# Patient Record
Sex: Female | Born: 1985
Health system: Southern US, Community
[De-identification: ages and names within clinical notes are randomized; demographics above are authoritative.]

## PROBLEM LIST (undated history)

## (undated) DIAGNOSIS — E079 Disorder of thyroid, unspecified: Secondary | ICD-10-CM

## (undated) DIAGNOSIS — F329 Major depressive disorder, single episode, unspecified: Secondary | ICD-10-CM

## (undated) DIAGNOSIS — F4310 Post-traumatic stress disorder, unspecified: Secondary | ICD-10-CM

## (undated) DIAGNOSIS — F429 Obsessive-compulsive disorder, unspecified: Secondary | ICD-10-CM

## (undated) DIAGNOSIS — F419 Anxiety disorder, unspecified: Secondary | ICD-10-CM

## (undated) DIAGNOSIS — F909 Attention-deficit hyperactivity disorder, unspecified type: Secondary | ICD-10-CM

## (undated) DIAGNOSIS — F32A Depression, unspecified: Secondary | ICD-10-CM

## (undated) DIAGNOSIS — K900 Celiac disease: Secondary | ICD-10-CM

## (undated) HISTORY — DX: Major depressive disorder, single episode, unspecified: F32.9

## (undated) HISTORY — DX: Celiac disease: K90.0

## (undated) HISTORY — DX: Attention-deficit hyperactivity disorder, unspecified type: F90.9

## (undated) HISTORY — DX: Post-traumatic stress disorder, unspecified: F43.10

## (undated) HISTORY — DX: Depression, unspecified: F32.A

## (undated) HISTORY — DX: Obsessive-compulsive disorder, unspecified: F42.9

## (undated) HISTORY — DX: Anxiety disorder, unspecified: F41.9

## (undated) HISTORY — DX: Disorder of thyroid, unspecified: E07.9

---
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
## 2005-04-02 DIAGNOSIS — Q909 Down syndrome, unspecified: Secondary | ICD-10-CM

## 2009-02-04 ENCOUNTER — Emergency Department (HOSPITAL_COMMUNITY): Admission: EM | Admit: 2009-02-04 | Discharge: 2009-02-04 | Payer: Self-pay | Admitting: Emergency Medicine

## 2009-11-17 IMAGING — US US OB TRANSVAGINAL MODIFY
1 series · 14 of 28 positions shown · non-contrast
Comparison: Transabdominal ultrasound pelvis 02/04/2009

CLINICAL DATA: Early pregnancy, cramping

TRANSVAGINAL OBSTETRIC US
TECHNIQUE: Transvaginal ultrasound was performed for complete
evaluation of the gestation as well as the maternal uterus, adnexal
regions, and pelvic cul-de-sac.

[Series 1: us ob transvaginal modify · 0.26mm/px · 14 of 48 slices shown]
[im 2/48]
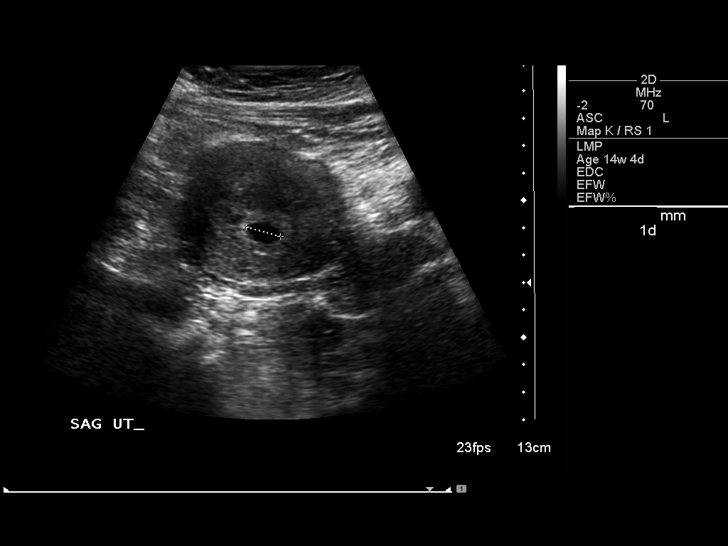
[im 6/48]
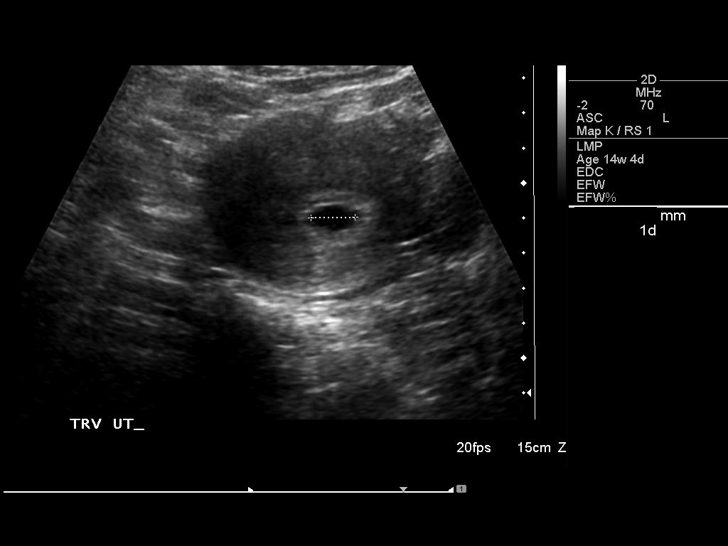
[im 9/48]
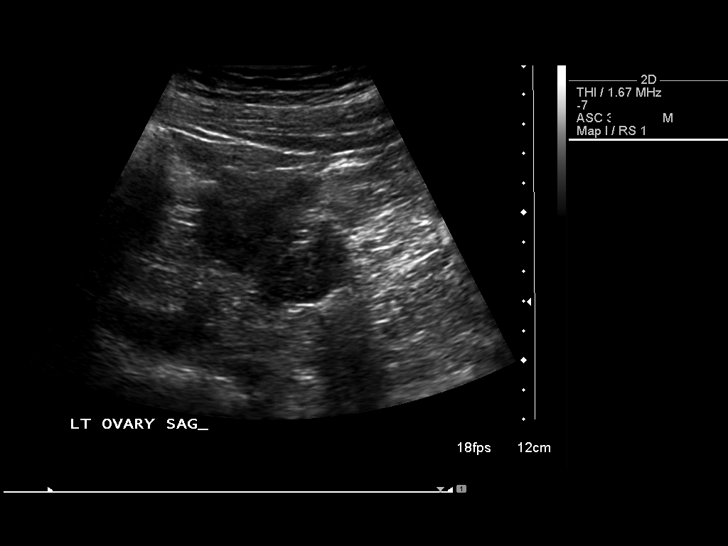
[im 13/48]
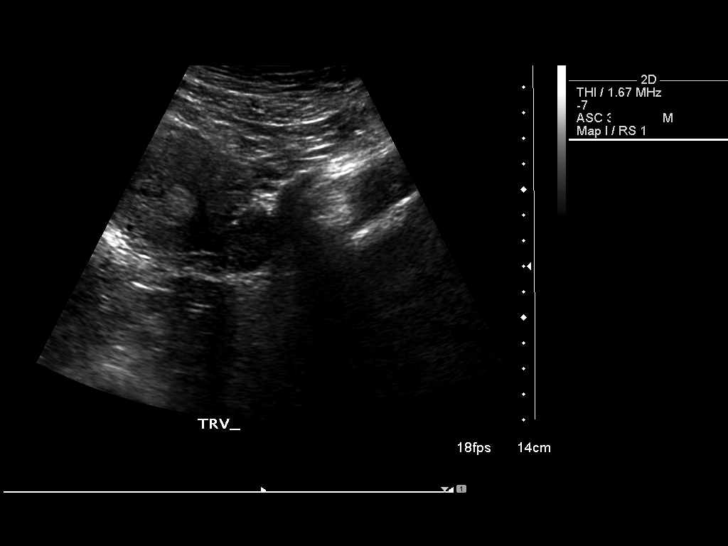
[im 16/48]
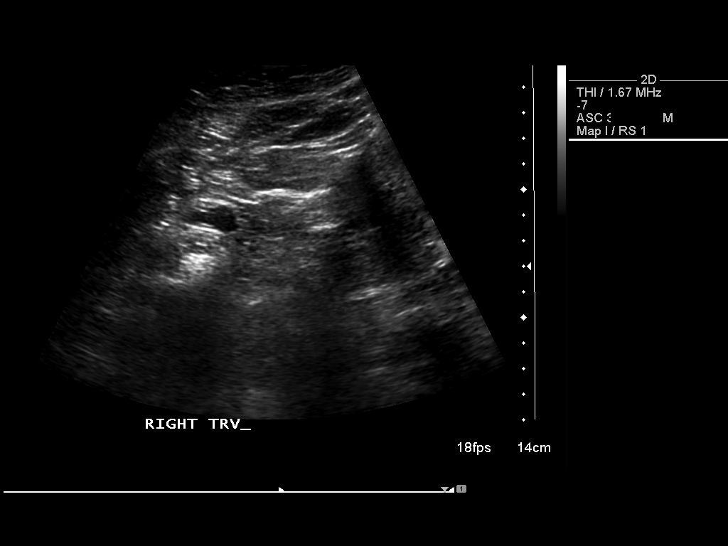
[im 20/48]
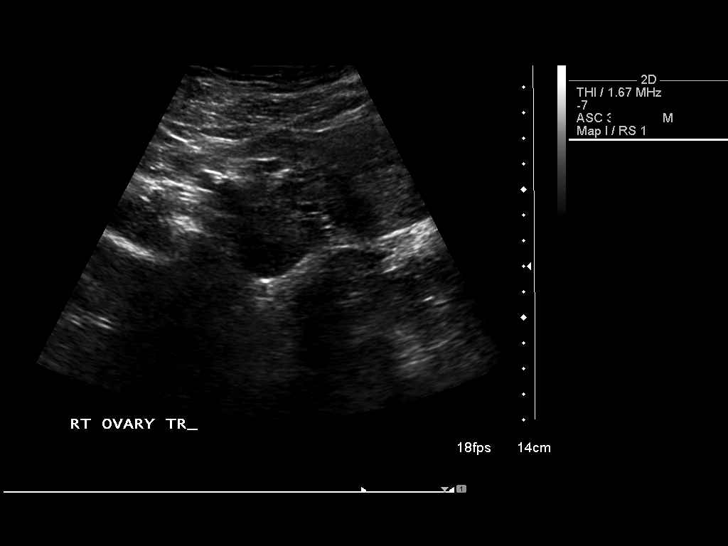
[im 23/48]
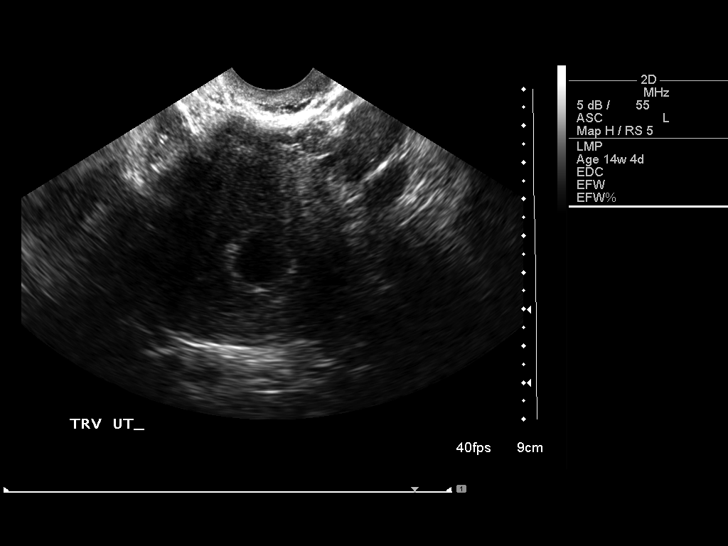
[im 27/48]
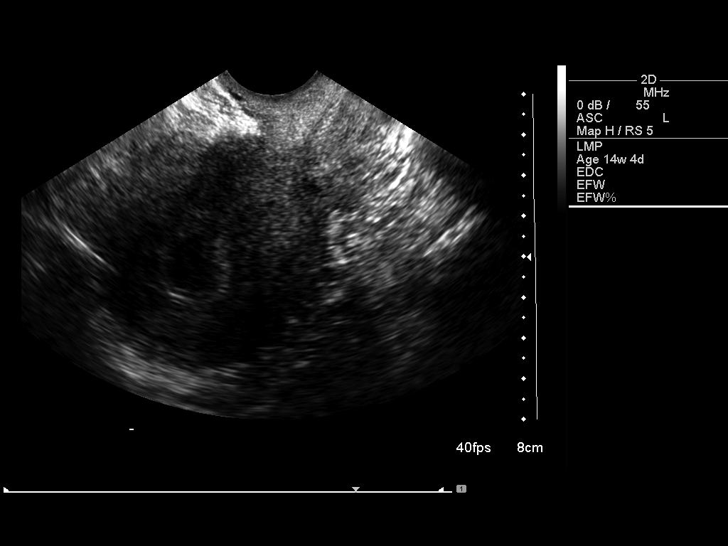
[im 30/48]
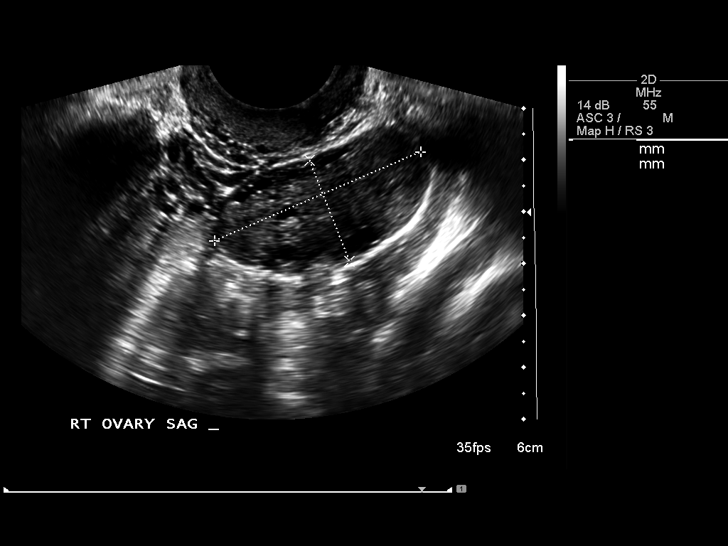
[im 34/48]
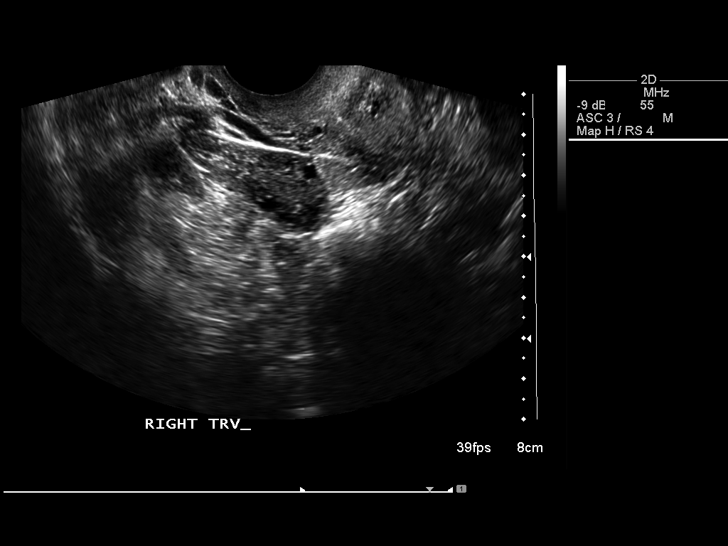
[im 37/48]
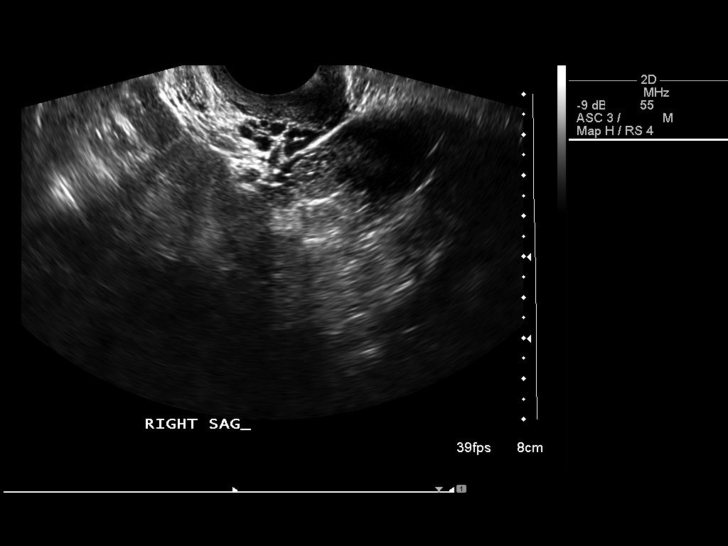
[im 41/48]
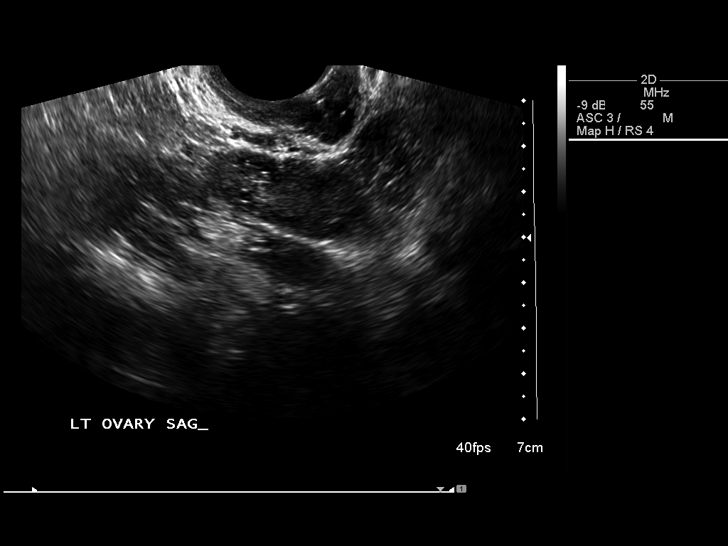
[im 44/48]
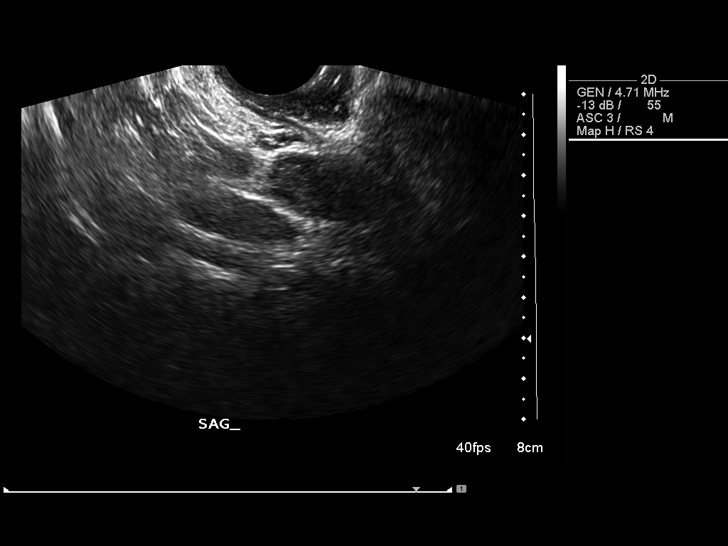
[im 48/48]
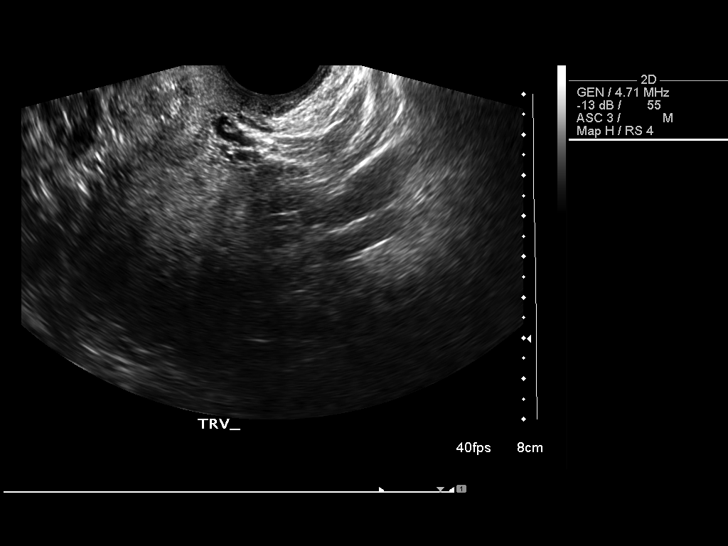

[14 of 28 positions shown; findings below may reference images not displayed]

Transvaginal side of the pelvis was performed in addition to the
transabdominal ultrasound pelvis, already reported. Findings seen
on transvaginal imaging support those seen on transabdominal
imaging.  Intrauterine gestational sac is seen with a mean sac
diameter correspond to 6 weeks 1 day EGA.  Ovaries unremarkable.
No subchorionic hemorrhage. No free fluid or adnexal mass.
IMPRESSION: Transvaginal imaging of pregnant pelvis is correlated with the
transabdominal ultrasound and results of the exams are concordant.
A single intrauterine gestational sac is seen containing a single
yolk sac,.  Follow-up assessment can be performed in the 10 14 days
to establish fetal viability.  Please refer to preceding report.

## 2010-11-07 LAB — BASIC METABOLIC PANEL
CO2: 26 mEq/L (ref 19–32)
GFR calc non Af Amer: >60 mL/min (ref >60)
Glucose, Bld: 98 mg/dL (ref 70–99)
Potassium: 3.6 mEq/L (ref 3.5–5.1)
Sodium: 139 mEq/L (ref 135–145)

## 2010-11-07 LAB — URINALYSIS, ROUTINE W REFLEX MICROSCOPIC
Bilirubin Urine: NEGATIVE
Glucose, UA: NEGATIVE mg/dL
Specific Gravity, Urine: >1.03 — ABNORMAL HIGH (ref 1.005–1.030)
Urobilinogen, UA: 0.2 mg/dL (ref 0.0–1.0)

## 2010-11-07 LAB — URINE MICROSCOPIC-ADD ON

## 2010-11-07 LAB — DIFFERENTIAL
Basophils Absolute: 0.1 10*3/uL (ref 0.0–0.1)
Eosinophils Relative: 1 % (ref 0–5)
Lymphocytes Relative: 21 % (ref 12–46)
Monocytes Absolute: 0.6 10*3/uL (ref 0.1–1.0)

## 2010-11-07 LAB — CBC
HCT: 37.6 % (ref 36.0–46.0)
Hemoglobin: 13.3 g/dL (ref 12.0–15.0)
MCHC: 35.3 g/dL (ref 30.0–36.0)
RDW: 12.6 % (ref 11.5–15.5)

## 2010-11-07 LAB — HCG, QUANTITATIVE, PREGNANCY: hCG, Beta Chain, Quant, S: 17171 m[IU]/mL — ABNORMAL HIGH (ref <5)

## 2017-01-06 ENCOUNTER — Other Ambulatory Visit (HOSPITAL_COMMUNITY)
Admission: RE | Admit: 2017-01-06 | Discharge: 2017-01-06 | Disposition: A | Discharge status: Home / Self Care | Payer: Self-pay | Source: Ambulatory Visit | Attending: Obstetrics and Gynecology | Admitting: Obstetrics and Gynecology

## 2017-01-06 ENCOUNTER — Encounter: Payer: Self-pay | Admitting: Obstetrics and Gynecology

## 2017-01-06 ENCOUNTER — Ambulatory Visit (INDEPENDENT_AMBULATORY_CARE_PROVIDER_SITE_OTHER): Payer: Medicaid Other | Admitting: Obstetrics and Gynecology

## 2017-01-06 VITALS — BP 120/80 | HR 82 | Ht 63.75 in | Wt 209.6 lb

## 2017-01-06 DIAGNOSIS — Z308 Encounter for other contraceptive management: Secondary | ICD-10-CM

## 2017-01-06 DIAGNOSIS — Z3009 Encounter for other general counseling and advice on contraception: Secondary | ICD-10-CM

## 2017-01-06 DIAGNOSIS — E02 Subclinical iodine-deficiency hypothyroidism: Secondary | ICD-10-CM

## 2017-01-06 DIAGNOSIS — Z3202 Encounter for pregnancy test, result negative: Secondary | ICD-10-CM

## 2017-01-06 DIAGNOSIS — Z01419 Encounter for gynecological examination (general) (routine) without abnormal findings: Secondary | ICD-10-CM | POA: Insufficient documentation

## 2017-01-06 LAB — POCT URINE PREGNANCY: PREG TEST UR: NEGATIVE

## 2017-01-06 MED ORDER — NORGESTIMATE-ETH ESTRADIOL 0.25-35 MG-MCG PO TABS: 1.0000 | ORAL_TABLET | Freq: Every day | ORAL | 11 refills | Status: DC

## 2017-01-06 NOTE — Addendum Note (Signed)
Addended byGaylyn Rong: Blondine Hottel on: 01/06/2017 01:33 PM   Modules accepted: Orders

## 2017-01-06 NOTE — Progress Notes (Signed)
  Assessment:  Annual Gyn Exam   Plan:  1. Pap smear done, next pap due 3 years  2. Return annually or prn 3.  Annual mammogram advised 4. Will discharge with Rx for Sprintec  Subjective:  Gabrielle Logan is a 31 y.o. female No obstetric history on file. who presents for annual exam. No LMP recorded. She would like her mirena IUD removed. She has had the mirena for 7 years and states she feels uncomfortable like she is pregnant. As a replacement she would like to take birth control pills. She reports adverse reactions with many other forms of contraception.   The following portions of the patient's history were reviewed and updated as appropriate: allergies, current medications, past family history, past medical history, past social history, past surgical history and problem list. No past medical history on file.  No past surgical history on file.  No current outpatient prescriptions on file.  Review of Systems Otherwise negative for acute change except as noted in the HPI.   Objective:  There were no vitals taken for this visit.   BMI: There is no height or weight on file to calculate BMI.  General Appearance: Alert, appropriate appearance for age. No acute distress HEENT: Grossly normal Neck / Thyroid:  Cardiovascular: RRR; normal S1, S2, no murmur Lungs: CTA bilaterally Back: No CVAT Breast Exam: No dimpling, nipple retraction or discharge. No masses or nodes. and No masses or nodes.No dimpling, nipple retraction or discharge. Gastrointestinal: Soft, non-tender, no masses or organomegaly Pelvic Exam: External genitalia: normal general appearance Vaginal: normal mucosa without prolapse or lesions Cervix: IUD successfully removed Adnexa: normal bimanual exam Uterus: small  PAP: Pap smear done today. Lymphatic Exam: Non-palpable nodes in neck, clavicular, axillary, or inguinal regions  Skin: no rash or abnormalities Neurologic: Normal gait and speech, no tremor   Psychiatric: Alert and oriented, appropriate affect.  Urinalysis:Not done  By signing my name below, I, Freida Busmaniana Omoyeni, attest that this documentation has been prepared under the direction and in the presence of Tilda BurrowJohn V Dorian Renfro, MD . Electronically Signed: Freida Busmaniana Omoyeni, Scribe. 01/06/2017. 11:32 AM. I personally performed the services described in this documentation, which was SCRIBED in my presence. The recorded information has been reviewed and considered accurate. It has been edited as necessary during review. Tilda BurrowFERGUSON,Avian Greenawalt V, MD

## 2017-01-06 NOTE — Patient Instructions (Addendum)

## 2017-01-07 LAB — TSH: TSH: 2.69 u[IU]/mL (ref 0.450–4.500)

## 2017-01-07 LAB — HIV ANTIBODY (ROUTINE TESTING W REFLEX): HIV SCREEN 4TH GENERATION: NONREACTIVE

## 2017-01-07 LAB — RPR: RPR: NONREACTIVE

## 2017-01-12 LAB — CYTOLOGY - PAP
DIAGNOSIS: NEGATIVE
HPV (WINDOPATH): DETECTED

## 2017-03-01 ENCOUNTER — Ambulatory Visit: Payer: Medicaid Other | Admitting: Adult Health

## 2017-03-15 ENCOUNTER — Ambulatory Visit: Payer: Medicaid Other | Admitting: Adult Health

## 2018-01-16 ENCOUNTER — Other Ambulatory Visit (HOSPITAL_COMMUNITY)
Admission: RE | Admit: 2018-01-16 | Discharge: 2018-01-16 | Disposition: A | Discharge status: Home / Self Care | Payer: Medicaid Other | Source: Ambulatory Visit | Attending: Advanced Practice Midwife | Admitting: Advanced Practice Midwife

## 2018-01-16 ENCOUNTER — Encounter: Payer: Self-pay | Admitting: Advanced Practice Midwife

## 2018-01-16 ENCOUNTER — Other Ambulatory Visit: Payer: Self-pay

## 2018-01-16 ENCOUNTER — Ambulatory Visit (INDEPENDENT_AMBULATORY_CARE_PROVIDER_SITE_OTHER): Payer: Medicaid Other | Admitting: Advanced Practice Midwife

## 2018-01-16 VITALS — BP 121/79 | HR 88 | Ht 63.0 in | Wt 206.0 lb

## 2018-01-16 DIAGNOSIS — R140 Abdominal distension (gaseous): Secondary | ICD-10-CM

## 2018-01-16 DIAGNOSIS — Z01419 Encounter for gynecological examination (general) (routine) without abnormal findings: Secondary | ICD-10-CM | POA: Diagnosis not present

## 2018-01-16 DIAGNOSIS — Z309 Encounter for contraceptive management, unspecified: Secondary | ICD-10-CM

## 2018-01-16 NOTE — Progress Notes (Signed)
Gabrielle Logan 32 y.o.  Vitals:   01/16/18 1011  BP: 121/79  Pulse: 88     Filed Weights   01/16/18 1011  Weight: 206 lb (93.4 kg)    Past Medical History: Past Medical History:  Diagnosis Date  . ADHD   . Anxiety   . Depression   . OCD (obsessive compulsive disorder)   . PTSD (post-traumatic stress disorder)   . Thyroid disease    hyperthyroid    Past Surgical History: Past Surgical History:  Procedure Laterality Date  . CESAREAN SECTION  2003,2006,2011    Family History: Family History  Problem Relation Age of Onset  . COPD Paternal Grandmother   . Thyroid disease Maternal Grandmother   . Other Father        heart issues  . Thyroid disease Mother   . Hypertension Mother   . Other Son        gastroschisis; wolf parkinson's white syndrome  . Down syndrome Son        passed away at age 687    Social History: Social History   Tobacco Use  . Smoking status: Never Smoker  . Smokeless tobacco: Current User    Types: Snuff  Substance Use Topics  . Alcohol use: Not Currently    Comment: rarely  . Drug use: Not Currently    Types: Marijuana    Comment: occ    Allergies:  Allergies  Allergen Reactions  . Sulfa Antibiotics Anaphylaxis and Rash    Hard to breathe     No current outpatient medications on file.  History of Present Illness: here for pap. Last pap 2018, normal w/+ HR HPV.   Had Mirena removed 5.18.  Was regular, but over the past four months, periods have been more irregular. Also noticed consistent abdominal bloating, used to be intermittent, now is constant for the 4 months. Feels pregnant, but isn't . Wants to be pregnant. Boyfriend of 2 years has never fathered a child.  Review of Systems   Patient denies any headaches, blurred vision, shortness of breath, chest pain, abdominal pain, problems with bowel movements, urination, or intercourse.   Physical Exam: General:  Well developed, well nourished, no acute distress Skin:  Warm  and dry Neck:  Midline trachea, normal thyroid Lungs; Clear to auscultation bilaterally Breast:  No dominant palpable mass, retraction, or nipple discharge Cardiovascular: Regular rate and rhythm Abdomen:  Soft, non tender, doesn't appear bloated beyond normal Pelvic:  External genitalia is normal in appearance.  The vagina is normal in appearance.  The cervix is bulbous.  Uterus is felt to be normal size, shape, and contour.  No adnexal masses or tenderness noted. Exam limited by habitus Extremities:  No swelling or varicosities noted Psych:  No mood changes.     Impression: normal GYN exam; if pap still + HR HPV, get colpo  Abdominal bloating; sounds GI related, but will get pelvic US to R/O ovarian issue  PNV--start now   Semen analysis ordered

## 2018-01-17 ENCOUNTER — Other Ambulatory Visit: Payer: Self-pay | Admitting: *Deleted

## 2018-01-17 DIAGNOSIS — R140 Abdominal distension (gaseous): Secondary | ICD-10-CM

## 2018-01-17 LAB — CYTOLOGY - PAP
CHLAMYDIA, DNA PROBE: NEGATIVE
Diagnosis: NEGATIVE
HPV (WINDOPATH): NOT DETECTED
NEISSERIA GONORRHEA: NEGATIVE

## 2018-01-19 ENCOUNTER — Ambulatory Visit (HOSPITAL_COMMUNITY)
Admission: RE | Admit: 2018-01-19 | Discharge: 2018-01-19 | Disposition: A | Discharge status: Home / Self Care | Payer: Self-pay | Source: Ambulatory Visit | Attending: Advanced Practice Midwife | Admitting: Advanced Practice Midwife

## 2018-01-19 DIAGNOSIS — R140 Abdominal distension (gaseous): Secondary | ICD-10-CM | POA: Insufficient documentation

## 2019-01-17 ENCOUNTER — Ambulatory Visit (INDEPENDENT_AMBULATORY_CARE_PROVIDER_SITE_OTHER): Payer: Self-pay | Admitting: Adult Health

## 2019-01-17 ENCOUNTER — Other Ambulatory Visit: Payer: Self-pay

## 2019-01-17 ENCOUNTER — Encounter: Payer: Self-pay | Admitting: Adult Health

## 2019-01-17 VITALS — Ht 63.25 in | Wt 193.0 lb

## 2019-01-17 DIAGNOSIS — Z98891 History of uterine scar from previous surgery: Secondary | ICD-10-CM | POA: Insufficient documentation

## 2019-01-17 DIAGNOSIS — O3680X0 Pregnancy with inconclusive fetal viability, not applicable or unspecified: Secondary | ICD-10-CM | POA: Insufficient documentation

## 2019-01-17 DIAGNOSIS — Z3A01 Less than 8 weeks gestation of pregnancy: Secondary | ICD-10-CM | POA: Insufficient documentation

## 2019-01-17 DIAGNOSIS — Z8249 Family history of ischemic heart disease and other diseases of the circulatory system: Secondary | ICD-10-CM | POA: Insufficient documentation

## 2019-01-17 DIAGNOSIS — Z8279 Family history of other congenital malformations, deformations and chromosomal abnormalities: Secondary | ICD-10-CM

## 2019-01-17 DIAGNOSIS — Z3201 Encounter for pregnancy test, result positive: Secondary | ICD-10-CM | POA: Insufficient documentation

## 2019-01-17 MED ORDER — PROMETHAZINE HCL 25 MG PO TABS: 25.0000 mg | ORAL_TABLET | Freq: Four times a day (QID) | ORAL | 1 refills | Status: DC | PRN

## 2019-01-17 NOTE — Progress Notes (Signed)
Patient ID: Gabrielle Logan, female   DOB: 1985-11-22, 33 y.o.   MRN: [id number]   TELEHEALTH VIRTUAL GYNECOLOGY VISIT ENCOUNTER NOTE  I connected with Gabrielle Logan on 01/17/19 at 12:00 PM EDT by telephone at home and verified that I am speaking with the correct person using two identifiers.   I discussed the limitations, risks, security and privacy concerns of performing an evaluation and management service by telephone and the availability of in person appointments. I also discussed with the patient that there may be a patient responsible charge related to this service. The patient expressed understanding and agreed to proceed.   History:  Gabrielle Logan is a 33 y.o. [phone] female being evaluated today for missed period and +HPTs x 4, about 5+6 weeks by LMP, with EDD 09/13/19.She is separated, but engaged.She has had nausea and breat tenderness. She denies any abnormal vaginal discharge, bleeding,(spotted x 1) pelvic pain or other concerns.       Past Medical History:  Diagnosis Date  . ADHD   . Anxiety   . Depression   . OCD (obsessive compulsive disorder)   . PTSD (post-traumatic stress disorder)   . Thyroid disease    hyperthyroid   Past Surgical History:  Procedure Laterality Date  . CESAREAN SECTION  2003,2006,2011   The following portions of the patient's history were reviewed and updated as appropriate: allergies, current medications, past family history, past medical history, past social history, past surgical history and problem list.   Health Maintenance: Normal pap with negative HPV 01/16/09.  Review of Systems:  Pertinent items noted in HPI and remainder of comprehensive ROS otherwise negative.  Physical Exam:   General:  Alert, oriented and cooperative.   Mental Status: Normal mood and affect perceived. Normal judgment and thought content.  Physical exam deferred due to nature of the encounter Ht 5' 3.25" (1.607 m)   Wt 193 lb (87.5 kg)   LMP  12/07/2018   BMI 33.92 kg/m per pt. Fall risk is low.   Labs and Imaging No results found for this or any previous visit (from the past 336 hour(s)). No results found.    Assessment and Plan:     1. Positive pregnancy test -4+HPTs  2. Less than [redacted] weeks gestation of pregnancy -continue PNV Meds ordered this encounter  Medications  . promethazine (PHENERGAN) 25 MG tablet    Sig: Take 1 tablet (25 mg total) by mouth every 6 (six) hours as needed for nausea or vomiting.    Dispense:  30 tablet    Refill:  1    Order Specific Question:   Supervising Provider    Answer:   Gabrielle Logan, Gabrielle Logan [[id number]]    3. Encounter to determine fetal viability of pregnancy, single or unspecified fetus -datign [place] in 2 weeks  - US OB Comp Less 14 Wks; Future  4. History of C-section -has had 3  5. Family history of Down syndrome -second son had and he died at age 69 was hit by car  6. Family history of Wolff-Parkinson-White (WPW) syndrome -first son has, and also had gastroschisis        I discussed the assessment and treatment plan with the patient. The patient was provided an opportunity to ask questions and all were answered. The patient agreed with the plan and demonstrated an understanding of the instructions.   The patient was advised to call back or seek an in-person evaluation/go to the ED if the symptoms worsen or if  the condition fails to improve as anticipated.  I provided 11 minutes of non-face-to-face time during this encounter.   Cyril MourningJennifer , NP [facility]

## 2019-01-31 ENCOUNTER — Other Ambulatory Visit: Payer: Self-pay

## 2019-01-31 ENCOUNTER — Ambulatory Visit (INDEPENDENT_AMBULATORY_CARE_PROVIDER_SITE_OTHER): Payer: Medicaid Other

## 2019-01-31 DIAGNOSIS — Z3A01 Less than 8 weeks gestation of pregnancy: Secondary | ICD-10-CM

## 2019-01-31 DIAGNOSIS — O3680X0 Pregnancy with inconclusive fetal viability, not applicable or unspecified: Secondary | ICD-10-CM

## 2019-01-31 NOTE — Progress Notes (Signed)
[place] 6+5 wks,single IUP w/ys,positive fht 114 bpm,normal ovaries bilat,crl 8.38 mm

## 2019-03-06 ENCOUNTER — Telehealth: Payer: Self-pay | Admitting: Obstetrics & Gynecology

## 2019-03-06 NOTE — Telephone Encounter (Signed)
Patient called, stated that she's [redacted] weeks pregnant.  Stated that she thinks she has a pinched nerve that's affecting her left side and leg gives way.  She stated that her employer is a requesting a note for work.  She stated that she's is having to call out due to the pain.  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  [phone]

## 2019-03-07 ENCOUNTER — Encounter: Payer: Self-pay | Admitting: Adult Health

## 2019-03-07 ENCOUNTER — Telehealth: Payer: Self-pay | Admitting: Women's Health

## 2019-03-07 ENCOUNTER — Telehealth: Payer: Self-pay | Admitting: *Deleted

## 2019-03-07 NOTE — Telephone Encounter (Signed)
Patient called because she is still in a lot of pain and stated she didn't get a call back yesterday.  [phone]

## 2019-03-07 NOTE — Telephone Encounter (Signed)
Patient stated she thinks she has a pinched nerve that is affecting her left side and leg gives out at times when she is standing. Lower left side of her back feels like there may be a "knot" but she does not know. She had sciatica in the past with previous pregnancies and was told to stay off her feet and to stretch.  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  She is off the next four days but is unsure what she needs to do. Please advise.

## 2019-03-07 NOTE — Progress Notes (Signed)
Note to excuse 8/5 and 03/07/19

## 2019-03-07 NOTE — Telephone Encounter (Signed)
About [redacted] weeks pregnant, lower left back feels tender and throbs and leg feels "fuzzy" ?pinched nerve, has appt 8/11, Has tried tylenol, try ice, and rest, has missed work this week, needs note for work missed 8/5 and 8/6 and is off til 8/1, so rest and ice.

## 2019-03-11 ENCOUNTER — Other Ambulatory Visit: Payer: Self-pay | Admitting: Obstetrics and Gynecology

## 2019-03-11 DIAGNOSIS — Z3682 Encounter for antenatal screening for nuchal translucency: Secondary | ICD-10-CM

## 2019-03-12 ENCOUNTER — Ambulatory Visit (INDEPENDENT_AMBULATORY_CARE_PROVIDER_SITE_OTHER): Payer: Medicaid Other | Admitting: Advanced Practice Midwife

## 2019-03-12 ENCOUNTER — Ambulatory Visit (INDEPENDENT_AMBULATORY_CARE_PROVIDER_SITE_OTHER): Payer: Medicaid Other

## 2019-03-12 ENCOUNTER — Ambulatory Visit: Payer: Medicaid Other | Admitting: *Deleted

## 2019-03-12 ENCOUNTER — Encounter: Payer: Self-pay | Admitting: Advanced Practice Midwife

## 2019-03-12 ENCOUNTER — Other Ambulatory Visit: Payer: Self-pay

## 2019-03-12 VITALS — BP 108/65 | HR 74 | Wt 198.5 lb

## 2019-03-12 DIAGNOSIS — Z1371 Encounter for nonprocreative screening for genetic disease carrier status: Secondary | ICD-10-CM

## 2019-03-12 DIAGNOSIS — Z1389 Encounter for screening for other disorder: Secondary | ICD-10-CM

## 2019-03-12 DIAGNOSIS — Z363 Encounter for antenatal screening for malformations: Secondary | ICD-10-CM

## 2019-03-12 DIAGNOSIS — Z331 Pregnant state, incidental: Secondary | ICD-10-CM

## 2019-03-12 DIAGNOSIS — Z3682 Encounter for antenatal screening for nuchal translucency: Secondary | ICD-10-CM | POA: Diagnosis not present

## 2019-03-12 DIAGNOSIS — Z3A12 12 weeks gestation of pregnancy: Secondary | ICD-10-CM

## 2019-03-12 DIAGNOSIS — Z3490 Encounter for supervision of normal pregnancy, unspecified, unspecified trimester: Secondary | ICD-10-CM | POA: Insufficient documentation

## 2019-03-12 DIAGNOSIS — Z3481 Encounter for supervision of other normal pregnancy, first trimester: Secondary | ICD-10-CM | POA: Diagnosis not present

## 2019-03-12 DIAGNOSIS — E0590 Thyrotoxicosis, unspecified without thyrotoxic crisis or storm: Secondary | ICD-10-CM

## 2019-03-12 DIAGNOSIS — Z1379 Encounter for other screening for genetic and chromosomal anomalies: Secondary | ICD-10-CM

## 2019-03-12 LAB — POCT URINALYSIS DIPSTICK OB
Blood, UA: NEGATIVE
Glucose, UA: NEGATIVE
Ketones, UA: NEGATIVE
Leukocytes, UA: NEGATIVE
Nitrite, UA: NEGATIVE

## 2019-03-12 MED ORDER — BLOOD PRESSURE MONITOR MISC: 0 refills | Status: DC

## 2019-03-12 NOTE — Progress Notes (Signed)
[place] 12+3 wks,measurements c/w dates,crl 63.55 mm,fhr 166 bpm,posterior placenta gr 0,normal ovaries bilat,NB present,NT 1.6 mm

## 2019-03-12 NOTE — Patient Instructions (Signed)
 First Trimester of Pregnancy The first trimester of pregnancy is from week 1 until the end of week 12 (months 1 through 3). A week after a sperm fertilizes an egg, the egg will implant on the wall of the uterus. This embryo will begin to develop into a baby. Genes from you and your partner are forming the baby. The female genes determine whether the baby is a boy or a girl. At 6-8 weeks, the eyes and face are formed, and the heartbeat can be seen on ultrasound. At the end of 12 weeks, all the baby's organs are formed.  Now that you are pregnant, you will want to do everything you can to have a healthy baby. Two of the most important things are to get good prenatal care and to follow your health care provider's instructions. Prenatal care is all the medical care you receive before the baby's birth. This care will help prevent, find, and treat any problems during the pregnancy and childbirth. BODY CHANGES Your body goes through many changes during pregnancy. The changes vary from woman to woman.   You may gain or lose a couple of pounds at first.  You may feel sick to your stomach (nauseous) and throw up (vomit). If the vomiting is uncontrollable, call your health care provider.  You may tire easily.  You may develop headaches that can be relieved by medicines approved by your health care provider.  You may urinate more often. Painful urination may mean you have a bladder infection.  You may develop heartburn as a result of your pregnancy.  You may develop constipation because certain hormones are causing the muscles that push waste through your intestines to slow down.  You may develop hemorrhoids or swollen, bulging veins (varicose veins).  Your breasts may begin to grow larger and become tender. Your nipples may stick out more, and the tissue that surrounds them (areola) may become darker.  Your gums may bleed and may be sensitive to brushing and flossing.  Dark spots or blotches  (chloasma, mask of pregnancy) may develop on your face. This will likely fade after the baby is born.  Your menstrual periods will stop.  You may have a loss of appetite.  You may develop cravings for certain kinds of food.  You may have changes in your emotions from day to day, such as being excited to be pregnant or being concerned that something may go wrong with the pregnancy and baby.  You may have more vivid and strange dreams.  You may have changes in your hair. These can include thickening of your hair, rapid growth, and changes in texture. Some women also have hair loss during or after pregnancy, or hair that feels dry or thin. Your hair will most likely return to normal after your baby is born. WHAT TO EXPECT AT YOUR PRENATAL VISITS During a routine prenatal visit:  You will be weighed to make sure you and the baby are growing normally.  Your blood pressure will be taken.  Your abdomen will be measured to track your baby's growth.  The fetal heartbeat will be listened to starting around week 10 or 12 of your pregnancy.  Test results from any previous visits will be discussed. Your health care provider may ask you:  How you are feeling.  If you are feeling the baby move.  If you have had any abnormal symptoms, such as leaking fluid, bleeding, severe headaches, or abdominal cramping.  If you have any questions. Other   tests that may be performed during your first trimester include:  Blood tests to find your blood type and to check for the presence of any previous infections. They will also be used to check for low iron levels (anemia) and Rh antibodies. Later in the pregnancy, blood tests for diabetes will be done along with other tests if problems develop.  Urine tests to check for infections, diabetes, or protein in the urine.  An ultrasound to confirm the proper growth and development of the baby.  An amniocentesis to check for possible genetic problems.  Fetal  screens for spina bifida and Down syndrome.  You may need other tests to make sure you and the baby are doing well. HOME CARE INSTRUCTIONS  Medicines  Follow your health care provider's instructions regarding medicine use. Specific medicines may be either safe or unsafe to take during pregnancy.  Take your prenatal vitamins as directed.  If you develop constipation, try taking a stool softener if your health care provider approves. Diet  Eat regular, well-balanced meals. Choose a variety of foods, such as meat or vegetable-based protein, fish, milk and low-fat dairy products, vegetables, fruits, and whole grain breads and cereals. Your health care provider will help you determine the amount of weight gain that is right for you.  Avoid raw meat and uncooked cheese. These carry germs that can cause birth defects in the baby.  Eating four or five small meals rather than three large meals a day may help relieve nausea and vomiting. If you start to feel nauseous, eating a few soda crackers can be helpful. Drinking liquids between meals instead of during meals also seems to help nausea and vomiting.  If you develop constipation, eat more high-fiber foods, such as fresh vegetables or fruit and whole grains. Drink enough fluids to keep your urine clear or pale yellow. Activity and Exercise  Exercise only as directed by your health care provider. Exercising will help you:  Control your weight.  Stay in shape.  Be prepared for labor and delivery.  Experiencing pain or cramping in the lower abdomen or low back is a good sign that you should stop exercising. Check with your health care provider before continuing normal exercises.  Try to avoid standing for long periods of time. Move your legs often if you must stand in one place for a long time.  Avoid heavy lifting.  Wear low-heeled shoes, and practice good posture.  You may continue to have sex unless your health care provider directs you  otherwise. Relief of Pain or Discomfort  Wear a good support bra for breast tenderness.   Take warm sitz baths to soothe any pain or discomfort caused by hemorrhoids. Use hemorrhoid cream if your health care provider approves.   Rest with your legs elevated if you have leg cramps or low back pain.  If you develop varicose veins in your legs, wear support hose. Elevate your feet for 15 minutes, 3-4 times a day. Limit salt in your diet. Prenatal Care  Schedule your prenatal visits by the twelfth week of pregnancy. They are usually scheduled monthly at first, then more often in the last 2 months before delivery.  Write down your questions. Take them to your prenatal visits.  Keep all your prenatal visits as directed by your health care provider. Safety  Wear your seat belt at all times when driving.  Make a list of emergency phone numbers, including numbers for family, friends, the hospital, and police and fire departments. General   Tips  Ask your health care provider for a referral to a local prenatal education class. Begin classes no later than at the beginning of month 6 of your pregnancy.  Ask for help if you have counseling or nutritional needs during pregnancy. Your health care provider can offer advice or refer you to specialists for help with various needs.  Do not use hot tubs, steam rooms, or saunas.  Do not douche or use tampons or scented sanitary pads.  Do not cross your legs for long periods of time.  Avoid cat litter boxes and soil used by cats. These carry germs that can cause birth defects in the baby and possibly loss of the fetus by miscarriage or stillbirth.  Avoid all smoking, herbs, alcohol, and medicines not prescribed by your health care provider. Chemicals in these affect the formation and growth of the baby.  Schedule a dentist appointment. At home, brush your teeth with a soft toothbrush and be gentle when you floss. SEEK MEDICAL CARE IF:   You have  dizziness.  You have mild pelvic cramps, pelvic pressure, or nagging pain in the abdominal area.  You have persistent nausea, vomiting, or diarrhea.  You have a bad smelling vaginal discharge.  You have pain with urination.  You notice increased swelling in your face, hands, legs, or ankles. SEEK IMMEDIATE MEDICAL CARE IF:   You have a fever.  You are leaking fluid from your vagina.  You have spotting or bleeding from your vagina.  You have severe abdominal cramping or pain.  You have rapid weight gain or loss.  You vomit blood or material that looks like coffee grounds.  You are exposed to German measles and have never had them.  You are exposed to fifth disease or chickenpox.  You develop a severe headache.  You have shortness of breath.  You have any kind of trauma, such as from a fall or a car accident. Document Released: 07/12/2001 Document Revised: 12/02/2013 Document Reviewed: 05/28/2013 Exit[employer] Patient Information 2015 [employer]. This information is not intended to replace advice given to you by your health care provider. Make sure you discuss any questions you have with your health care provider.   Nausea & Vomiting  Have saltine crackers or pretzels by your bed and eat a few bites before you raise your head out of bed in the morning  Eat small frequent meals throughout the day instead of large meals  Drink plenty of fluids throughout the day to stay hydrated, just don't drink a lot of fluids with your meals.  This can make your stomach fill up faster making you feel sick  Do not brush your teeth right after you eat  Products with real ginger are good for nausea, like ginger ale and ginger hard candy Make sure it says made with real ginger!  Sucking on sour candy like lemon heads is also good for nausea  If your prenatal vitamins make you nauseated, take them at night so you will sleep through the nausea  Sea Bands  If you feel like you need  medicine for the nausea & vomiting please let us know  If you are unable to keep any fluids or food down please let us know   Constipation  Drink plenty of fluid, preferably water, throughout the day  Eat foods high in fiber such as fruits, vegetables, and grains  Exercise, such as walking, is a good way to keep your bowels regular  Drink warm fluids, especially warm   prune juice, or decaf coffee  Eat a 1/2 cup of real oatmeal (not instant), 1/2 cup applesauce, and 1/2-1 cup warm prune juice every day  If needed, you may take Colace (docusate sodium) stool softener once or twice a day to help keep the stool soft. If you are pregnant, wait until you are out of your first trimester (12-14 weeks of pregnancy)  If you still are having problems with constipation, you may take Miralax once daily as needed to help keep your bowels regular.  If you are pregnant, wait until you are out of your first trimester (12-14 weeks of pregnancy)  Safe Medications in Pregnancy   Acne: Benzoyl Peroxide Salicylic Acid  Backache/Headache: Tylenol: 2 regular strength every 4 hours OR              2 Extra strength every 6 hours  Colds/Coughs/Allergies: Benadryl (alcohol free) 25 mg every 6 hours as needed Breath right strips Claritin Cepacol throat lozenges Chloraseptic throat spray Cold-Eeze- up to three times per day Cough drops, alcohol free Flonase (by prescription only) Guaifenesin Mucinex Robitussin DM (plain only, alcohol free) Saline nasal spray/drops Sudafed (pseudoephedrine) & Actifed ** use only after [redacted] weeks gestation and if you do not have high blood pressure Tylenol Vicks Vaporub Zinc lozenges Zyrtec   Constipation: Colace Ducolax suppositories Fleet enema Glycerin suppositories Metamucil Milk of magnesia Miralax Senokot Smooth move tea  Diarrhea: Kaopectate Imodium A-D  *NO pepto Bismol  Hemorrhoids: Anusol Anusol HC Preparation  H Tucks  Indigestion: Tums Maalox Mylanta Zantac  Pepcid  Insomnia: Benadryl (alcohol free) 25mg every 6 hours as needed Tylenol PM Unisom, no Gelcaps  Leg Cramps: Tums MagGel  Nausea/Vomiting:  Bonine Dramamine Emetrol Ginger extract Sea bands Meclizine  Nausea medication to take during pregnancy:  Unisom (doxylamine succinate 25 mg tablets) Take one tablet daily at bedtime. If symptoms are not adequately controlled, the dose can be increased to a maximum recommended dose of two tablets daily (1/2 tablet in the morning, 1/2 tablet mid-afternoon and one at bedtime). Vitamin B6 100mg tablets. Take one tablet twice a day (up to 200 mg per day).  Skin Rashes: Aveeno products Benadryl cream or 25mg every 6 hours as needed Calamine Lotion 1% cortisone cream  Yeast infection: Gyne-lotrimin 7 Monistat 7   **If taking multiple medications, please check labels to avoid duplicating the same active ingredients **take medication as directed on the label ** Do not exceed 4000 mg of tylenol in 24 hours **Do not take medications that contain aspirin or ibuprofen      

## 2019-03-12 NOTE — Progress Notes (Signed)
INITIAL OBSTETRICAL VISIT Patient name: Gabrielle Logan MRN [id number]  Date of birth: 12-28-1985 Chief Complaint:   Initial Prenatal Visit (left lower back pain)  History of Present Illness:   Gabrielle Logan is a 33 y.o. 654P2102 Caucasian female at 132w3d by LMP/7 week US with an Estimated Date of Delivery: 09/21/19 being seen today for her initial obstetrical visit.   Her obstetrical history is significant for first child w/gastroscheisi (CS), 2nd child w/DS (died age 35 after being hit by a car), and a normal 3rd pregnancy. Has had repeat CS. New  FOB.     Today she reports nausea is abating, has some left sided MSK pain for about a week. Uses massage, cold packs..  Patient's last menstrual period was 12/07/2018. Last pap 2019  Results were: normal Review of Systems:   Pertinent items are noted in HPI Denies cramping/contractions, leakage of fluid, vaginal bleeding, abnormal vaginal discharge w/ itching/odor/irritation, headaches, visual changes, shortness of breath, chest pain, abdominal pain, severe nausea/vomiting, or problems with urination or bowel movements unless otherwise stated above.  Pertinent History Reviewed:  Reviewed past medical,surgical, social, obstetrical and family history.  Reviewed problem list, medications and allergies. OB History  Gravida Para Term Preterm AB Living  4 3 2 1   2   SAB TAB Ectopic Multiple Live Births          3    # Outcome Date GA Lbr Len/2nd Weight Sex Delivery Anes PTL Lv  4 Current           3 Term 09/24/09 3283w0d  7 lb 2 oz (3.232 kg) M CS-LTranv Spinal N LIV  2 Term 04/02/05 2754w0d  6 lb 9 oz (2.977 kg) M CS-LTranv Spinal  DEC     Complications: Down syndrome  1 Preterm 02/02/02 8010w0d  5 lb 13 oz (2.637 kg) M CS-LTranv Spinal N LIV     Complications: Gastroschisis   Physical Assessment:   Vitals:   03/12/19 1420  BP: 108/65  Pulse: 74  Weight: 198 lb 8 oz (90 kg)  Body mass index is 34.89 kg/m.       Physical  Examination:  General appearance - well appearing, and in no distress  Mental status - alert, oriented to person, place, and time  Psych:  She has a normal mood and affect  Skin - warm and dry, normal color, no suspicious lesions noted  Chest - effort normal, all lung fields clear to auscultation bilaterally  Heart - normal rate and regular rhythm  Abdomen - soft, nontender  Extremities:  No swelling or varicosities noted      via US US 12+3 wks,measurements c/w dates,crl 63.55 mm,fhr 166 bpm,posterior placenta gr 0,normal ovaries bilat,NB present,NT 1.6 mm Results for orders placed or performed in visit on 03/12/19 (from the past 24 hour(s))  POC Urinalysis Dipstick OB   Collection Time: 03/12/19  2:37 PM  Result Value Ref Range   Color, UA     Clarity, UA     Glucose, UA Negative Negative   Bilirubin, UA     Ketones, UA neg    Spec Grav, UA     Blood, UA neg    pH, UA     POC,PROTEIN,UA Trace Negative, Trace, Small (1+), Moderate (2+), Large (3+), 4+   Urobilinogen, UA     Nitrite, UA neg    Leukocytes, UA Negative Negative   Appearance     Odor      Assessment & Plan:  1) Low-Risk Pregnancy [id number] at [redacted]w[redacted]d with an Estimated Date of Delivery: 09/21/19   2) Initial OB visit  3) Hx CS X3  4)  Was told at HD in [place] that she was "hyperthyroid", no meds rxd, TSH 2018 here 2.  Will check again today.    Meds:  Meds ordered this encounter  Medications  . Blood Pressure Monitor MISC    Sig: For regular home bp monitoring during pregnancy    Dispense:  1 each    Refill:  0    Z34.90    Initial labs obtained Continue prenatal vitamins Reviewed n/v relief measures and warning s/s to report Reviewed recommended weight gain based on pre-gravid BMI Encouraged well-balanced diet Watched video for carrier screening/genetic testing:  Genetic Screening discussed First Screen and Integrated Screen: requested Cystic fibrosis screening requested SMA screening  requested Fragile X screening requested Ultrasound discussed; fetal survey: requested CCNC completed  Follow-up: Return in about 6 weeks (around 04/23/2019) for [place], [id number].   Orders Placed This Encounter  Procedures  . GC/Chlamydia Probe Amp  . Urine Culture  . Integrated 1  . Obstetric Panel, Including HIV  . Sickle cell screen  . Urinalysis, Routine w reflex microscopic  . MaterniT 21 plus Core, Blood  . Inheritest Core(CF97,SMA,FraX)  . Pain Management Screening Profile (10S)  . TSH  . POC Urinalysis Dipstick OB    Gabrielle Fudge DNP, CNM 03/12/2019 3:31 PM

## 2019-03-13 LAB — TSH: TSH: 1.58 u[IU]/mL (ref 0.450–4.500)

## 2019-03-14 LAB — URINE CULTURE

## 2019-03-20 IMAGING — US US PELVIS COMPLETE TRANSABD/TRANSVAG
1 series · 14 of 25 positions shown · non-contrast
Comparison: None

CLINICAL DATA: Abdominal bloating for 4 months. Previous C-section
x3. LMP 01/11/2018. Gravida 3 para 3.

EXAM:
TRANSABDOMINAL AND TRANSVAGINAL ULTRASOUND OF PELVIS
TECHNIQUE: Both transabdominal and transvaginal ultrasound examinations of the
pelvis were performed. Transabdominal technique was performed for
global imaging of the pelvis including uterus, ovaries, adnexal
regions, and pelvic cul-de-sac. It was necessary to proceed with
endovaginal exam following the transabdominal exam to visualize the
endometrium and ovaries.

[Series 1: us pelvis complete transabd/transvag · 0.24mm/px · 14 of 66 slices shown]
[im 1/66]
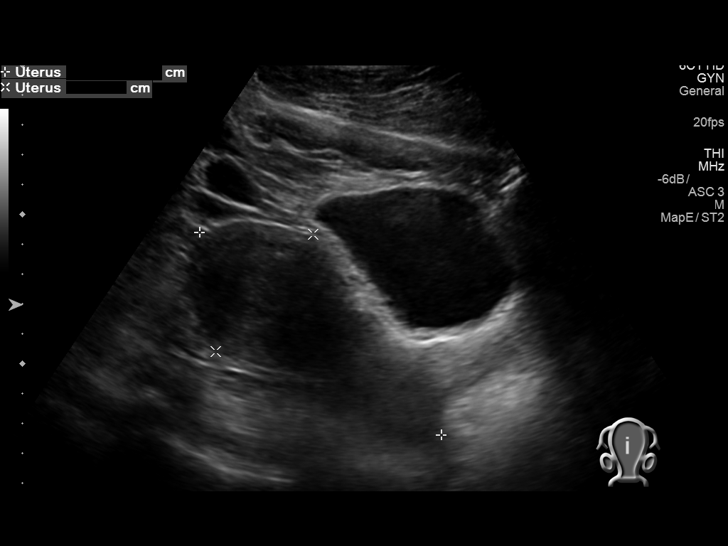
[im 6/66]
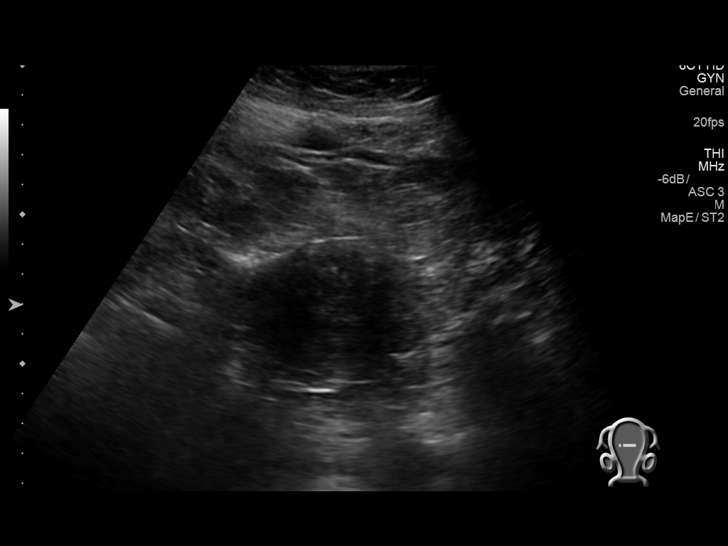
[im 11/66]
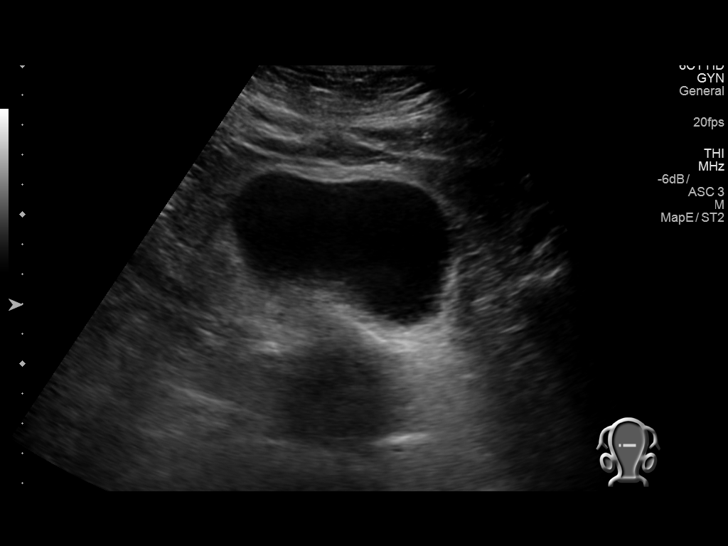
[im 17/66]
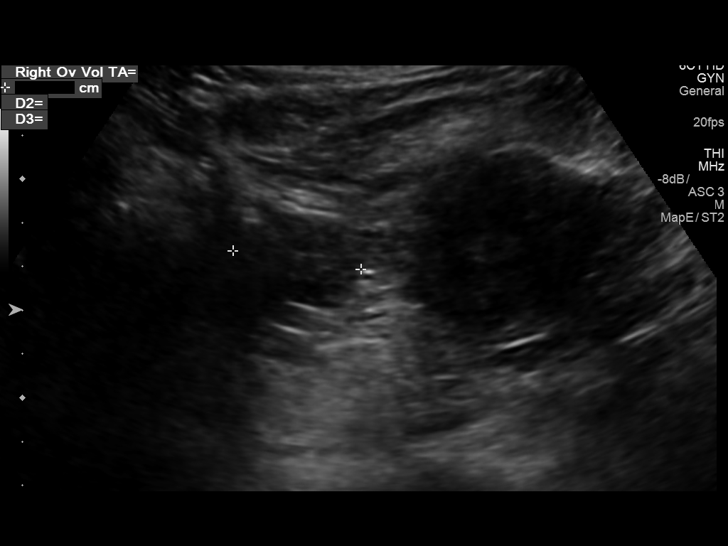
[im 22/66]
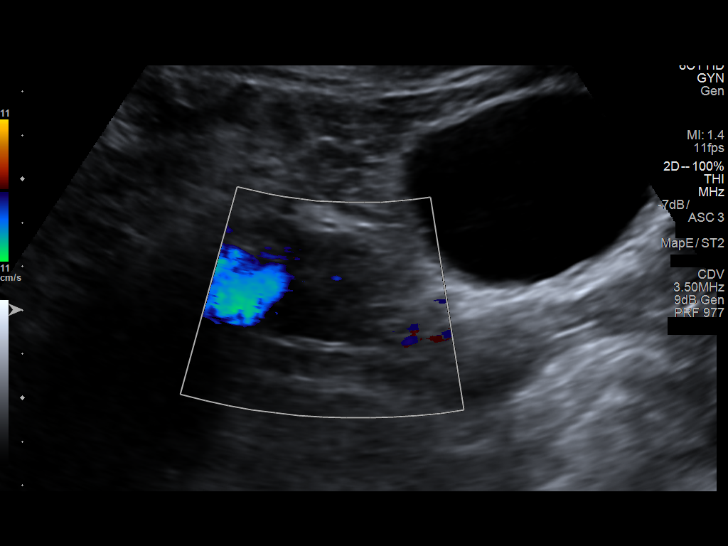
[im 25/66]
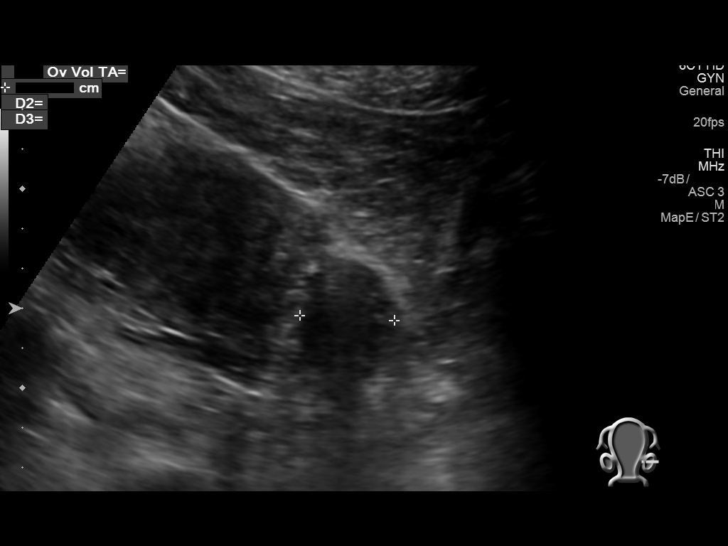
[im 30/66]
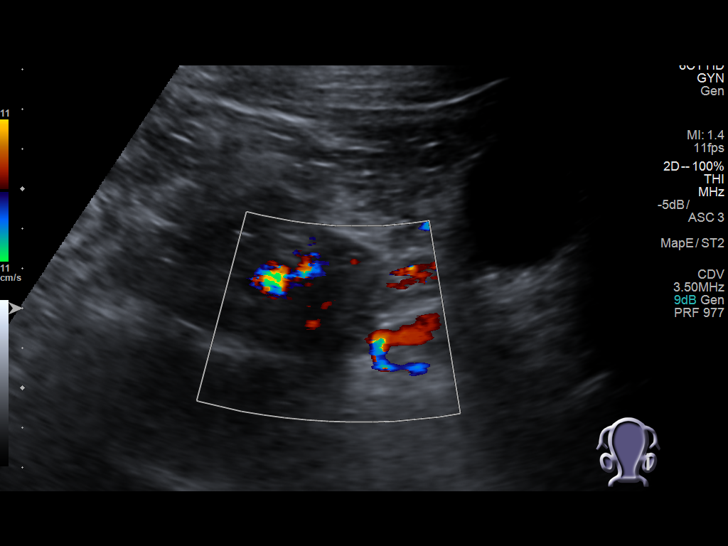
[im 36/66]
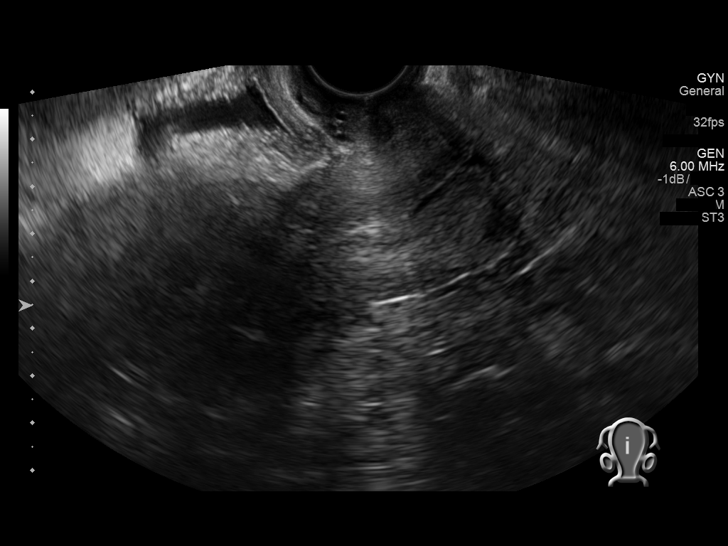
[im 41/66]
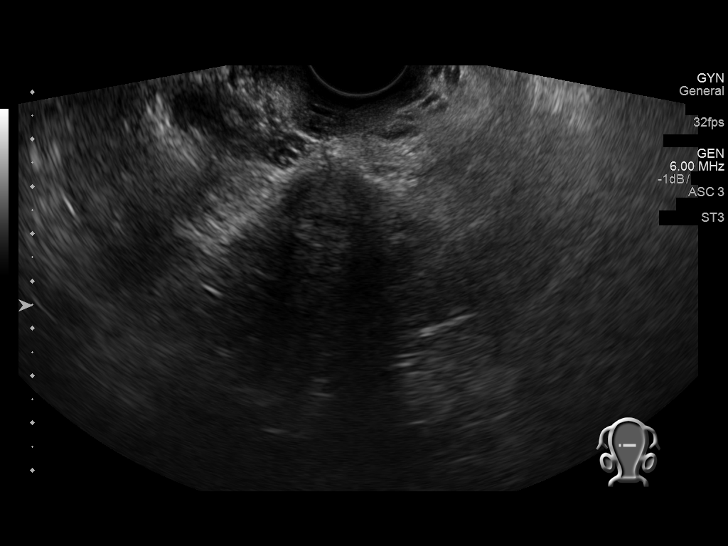
[im 44/66]
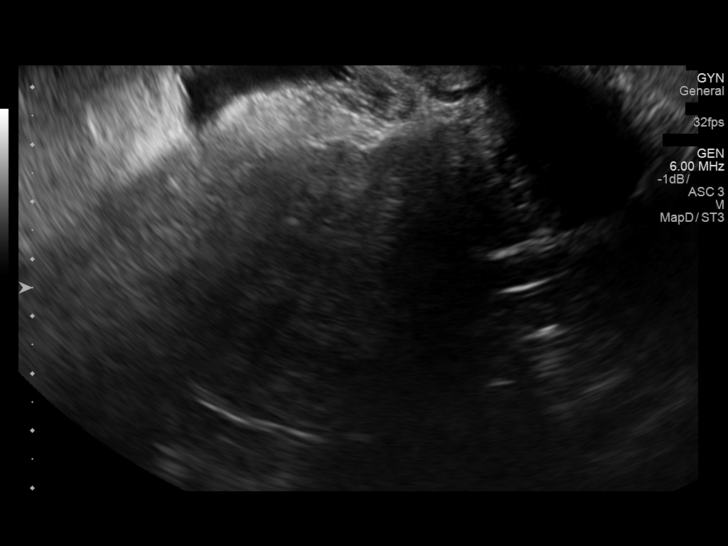
[im 49/66]
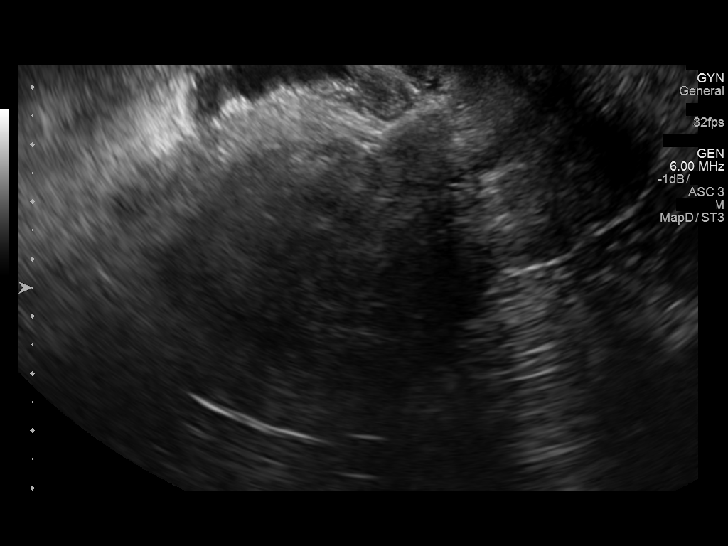
[im 55/66]
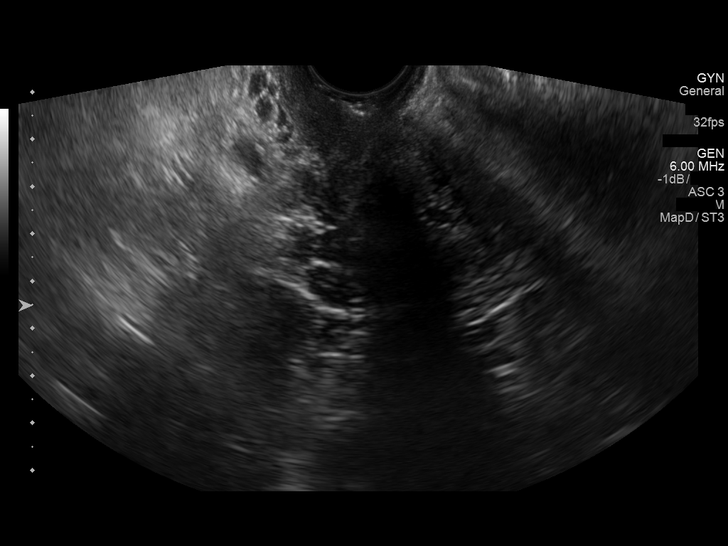
[im 60/66]
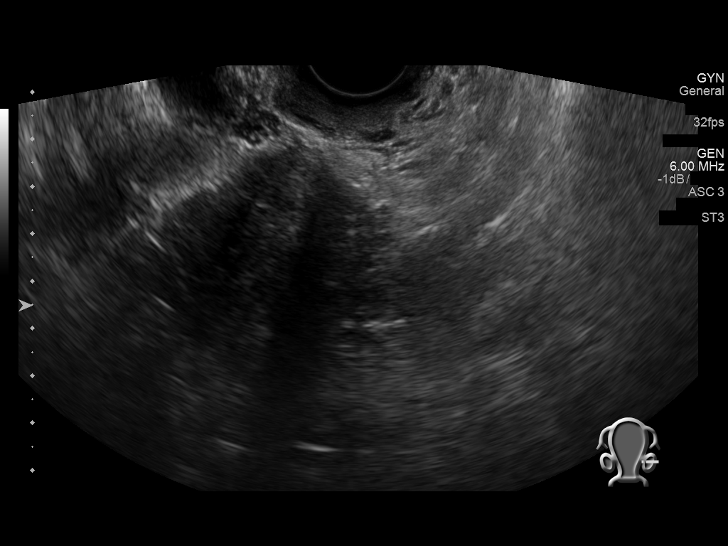
[im 66/66]
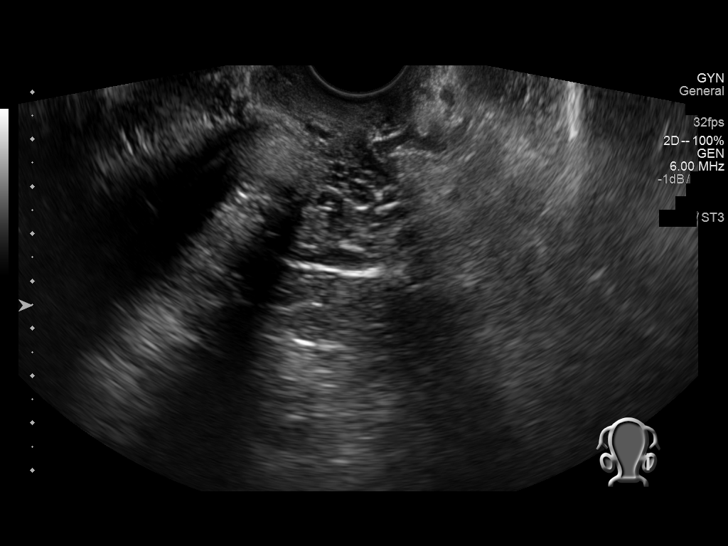

[14 of 25 positions shown; findings below may reference images not displayed]

FINDINGS: Uterus

Measurements: 10.6 x 5.1 x 5.0 centimeters. No fibroids or other
mass visualized.

Endometrium

Thickness: 7 millimeters.  No focal abnormality visualized.

Right ovary

Measurements: 3.0 x 3.1 x 2.5 centimeters. Normal appearance/no
adnexal mass.

Left ovary

Measurements: 2.4 x 2.7 x 3.0 centimeters. Normal appearance/no
adnexal mass.

Other findings

No abnormal free fluid.
IMPRESSION: Normal pelvic ultrasound.

## 2019-04-02 LAB — URINALYSIS, ROUTINE W REFLEX MICROSCOPIC
Bilirubin, UA: NEGATIVE
Glucose, UA: NEGATIVE
Ketones, UA: NEGATIVE
Leukocytes,UA: NEGATIVE
Nitrite, UA: NEGATIVE
Protein,UA: NEGATIVE
RBC, UA: NEGATIVE
Specific Gravity, UA: >=1.03 — AB (ref 1.005–1.030)
Urobilinogen, Ur: 0.2 mg/dL (ref 0.2–1.0)
pH, UA: 5.5 (ref 5.0–7.5)

## 2019-04-02 LAB — INHERITEST CORE(CF97,SMA,FRAX)

## 2019-04-02 LAB — MATERNIT 21 PLUS CORE, BLOOD
Fetal Fraction: 3
Result (T21): NEGATIVE
Trisomy 13 (Patau syndrome): NEGATIVE
Trisomy 18 (Edwards syndrome): NEGATIVE
Trisomy 21 (Down syndrome): NEGATIVE

## 2019-04-02 LAB — OBSTETRIC PANEL, INCLUDING HIV
Antibody Screen: NEGATIVE
Basophils Absolute: 0 10*3/uL (ref 0.0–0.2)
Basos: 0 %
EOS (ABSOLUTE): 0.1 10*3/uL (ref 0.0–0.4)
Eos: 1 %
HIV Screen 4th Generation wRfx: NONREACTIVE
Hematocrit: 40.9 % (ref 34.0–46.6)
Hemoglobin: 14.1 g/dL (ref 11.1–15.9)
Hepatitis B Surface Ag: NEGATIVE
Immature Grans (Abs): 0 10*3/uL (ref 0.0–0.1)
Immature Granulocytes: 0 %
Lymphocytes Absolute: 1.9 10*3/uL (ref 0.7–3.1)
Lymphs: 16 %
MCH: 31.3 pg (ref 26.6–33.0)
MCHC: 34.5 g/dL (ref 31.5–35.7)
MCV: 91 fL (ref 79–97)
Monocytes Absolute: 0.6 10*3/uL (ref 0.1–0.9)
Monocytes: 5 %
Neutrophils Absolute: 9 10*3/uL — ABNORMAL HIGH (ref 1.4–7.0)
Neutrophils: 78 %
Platelets: 270 10*3/uL (ref 150–450)
RBC: 4.5 x10E6/uL (ref 3.77–5.28)
RDW: 13.2 % (ref 11.7–15.4)
RPR Ser Ql: NONREACTIVE
Rh Factor: POSITIVE
Rubella Antibodies, IGG: 6.68 index (ref >0.99)
WBC: 11.6 10*3/uL — ABNORMAL HIGH (ref 3.4–10.8)

## 2019-04-02 LAB — INTEGRATED 1
Crown Rump Length: 63.6 mm
Gest. Age on Collection Date: 12.6 weeks
Maternal Age at EDD: 35.4 yr
Nuchal Translucency (NT): 1.6 mm
Number of Fetuses: 1
PAPP-A Value: 1142.3 ng/mL
Weight: 199 [lb_av]

## 2019-04-02 LAB — SICKLE CELL SCREEN: Sickle Cell Screen: NEGATIVE

## 2019-04-12 ENCOUNTER — Telehealth: Payer: Self-pay | Admitting: *Deleted

## 2019-04-12 NOTE — Telephone Encounter (Signed)
LMOVM wanting to f/u with patient regarding after hours call to nurse for anxiety.

## 2019-04-12 NOTE — Telephone Encounter (Signed)
Patient states she suffers from anxiety and depression for the past 6 years and has not been taking medication.  Prior to pregnancy she was smoking marijiuana which helped with her anxiety but is not currently smoking.  She has put in her notice at work which has been stressful and feels this will ease some of her stress.  She is reluctant to start any medication during the pregnancy. Offered for patient to be seen early next week, but patient declined.  Advised if she started having suicidal thoughts to call our office or go to the nearest hospital.  Pt verbalized understanding and stated she would.

## 2019-04-23 ENCOUNTER — Ambulatory Visit (INDEPENDENT_AMBULATORY_CARE_PROVIDER_SITE_OTHER): Payer: Medicaid Other | Admitting: Women's Health

## 2019-04-23 ENCOUNTER — Encounter: Payer: Self-pay | Admitting: Women's Health

## 2019-04-23 ENCOUNTER — Ambulatory Visit (INDEPENDENT_AMBULATORY_CARE_PROVIDER_SITE_OTHER): Payer: Medicaid Other

## 2019-04-23 ENCOUNTER — Other Ambulatory Visit: Payer: Self-pay

## 2019-04-23 VITALS — BP 112/73 | HR 82 | Wt 200.6 lb

## 2019-04-23 DIAGNOSIS — Z3A18 18 weeks gestation of pregnancy: Secondary | ICD-10-CM | POA: Diagnosis not present

## 2019-04-23 DIAGNOSIS — Z8279 Family history of other congenital malformations, deformations and chromosomal abnormalities: Secondary | ICD-10-CM | POA: Insufficient documentation

## 2019-04-23 DIAGNOSIS — Z363 Encounter for antenatal screening for malformations: Secondary | ICD-10-CM

## 2019-04-23 DIAGNOSIS — O4440 Low lying placenta NOS or without hemorrhage, unspecified trimester: Secondary | ICD-10-CM | POA: Insufficient documentation

## 2019-04-23 DIAGNOSIS — Z1379 Encounter for other screening for genetic and chromosomal anomalies: Secondary | ICD-10-CM

## 2019-04-23 DIAGNOSIS — R350 Frequency of micturition: Secondary | ICD-10-CM

## 2019-04-23 DIAGNOSIS — Z3482 Encounter for supervision of other normal pregnancy, second trimester: Secondary | ICD-10-CM

## 2019-04-23 DIAGNOSIS — O4442 Low lying placenta NOS or without hemorrhage, second trimester: Secondary | ICD-10-CM

## 2019-04-23 DIAGNOSIS — Z98891 History of uterine scar from previous surgery: Secondary | ICD-10-CM

## 2019-04-23 MED ORDER — PANTOPRAZOLE SODIUM 20 MG PO TBEC: 20.0000 mg | DELAYED_RELEASE_TABLET | Freq: Every day | ORAL | 6 refills | Status: DC

## 2019-04-23 MED ORDER — SERTRALINE HCL 25 MG PO TABS: 25.0000 mg | ORAL_TABLET | Freq: Every day | ORAL | 6 refills | Status: DC

## 2019-04-23 NOTE — Progress Notes (Signed)
LOW-RISK PREGNANCY VISIT Patient name: Gabrielle Logan MRN [id number]  Date of birth: 1986/01/30 Chief Complaint:   Routine Prenatal Visit  History of Present Illness:   KEANI GOTCHER is a 33 y.o. G62P2102 female at [redacted]w[redacted]d with an Estimated Date of Delivery: 09/21/19 being seen today for ongoing management of a low-risk pregnancy.  Today she reports urinary frequency, no other sx. Dep/anx- mostly anxiety, no meds in 60yrs, paxil/klonopin worked best for her in past but willing to try whatever safest for pregnancy. Denies SI/HI. Reflux- TUMS not helping. Contractions: Not present. Vag. Bleeding: None.  Movement: Present. denies leaking of fluid. Review of Systems:   Pertinent items are noted in HPI Denies abnormal vaginal discharge w/ itching/odor/irritation, headaches, visual changes, shortness of breath, chest pain, abdominal pain, severe nausea/vomiting, or problems with urination or bowel movements unless otherwise stated above. Pertinent History Reviewed:  Reviewed past medical,surgical, social, obstetrical and family history.  Reviewed problem list, medications and allergies. Physical Assessment:   Vitals:   04/23/19 1106  BP: 112/73  Pulse: 82  Weight: 200 lb 9.6 oz (91 kg)  Body mass index is 35.25 kg/m.        Physical Examination:   General appearance: Well appearing, and in no distress  Mental status: Alert, oriented to person, place, and time  Skin: Warm & dry  Cardiovascular: Normal heart rate noted  Respiratory: Normal respiratory effort, no distress  Abdomen: Soft, gravid, nontender  Pelvic: Cervical exam deferred         Extremities: Edema: None  Fetal Status:     Movement: Present    [place] 44+3 wks,cephalic,posterior low lying placenta gr 0,tip of placenta to cx 1.25 cm ,svp of fluid 5.2 cm,normal ovaries bilat,fhr 159 bpm,cx length 4.3 cm,efw 261 g 70%,anatomy complete  No results found for this or any previous visit (from the past 24 hour(s)).  Assessment  & Plan:  1) Low-risk pregnancy [id number] at [redacted]w[redacted]d with an Estimated Date of Delivery: 09/21/19   2) Low-lying placenta, 1.25cm from os, pelvic rest, recheck @ 28wks  3) Prev c/s x 3> for RCS  4) Reflux> rx protonix  5) Dep/anx> rx zoloft 25mg , understands can take few weeks to notice improvements, f/u 4wks  6) Family h/o down syndrome and gastroschisis  7) Urinary frequency> send urine cx   Meds:  Meds ordered this encounter  Medications  . pantoprazole (PROTONIX) 20 MG tablet    Sig: Take 1 tablet (20 mg total) by mouth daily.    Dispense:  30 tablet    Refill:  6    Order Specific Question:   Supervising Provider    Answer:   Elonda Husky, LUTHER H [[id number]]  . sertraline (ZOLOFT) 25 MG tablet    Sig: Take 1 tablet (25 mg total) by mouth daily.    Dispense:  30 tablet    Refill:  6    Order Specific Question:   Supervising Provider    Answer:   Florian Buff [[id number]]   Labs/procedures today: anatomy u/s, 2nd IT  Plan:  Continue routine obstetrical care   Reviewed: Preterm labor symptoms and general obstetric precautions including but not limited to vaginal bleeding, contractions, leaking of fluid and fetal movement were reviewed in detail with the patient.  All questions were answered. Has home bp cuff. Check bp weekly, let us know if >140/90.   Follow-up: Return in about 4 weeks (around 05/21/2019) for LROB, MyChart Video.  Orders Placed This Encounter  Procedures  .  Urine Culture  . INTEGRATED 2   Cheral Marker CNM, [facility] 04/23/2019 11:52 AM

## 2019-04-23 NOTE — Patient Instructions (Addendum)
Gabrielle Logan, I greatly value your feedback.  If you receive a survey following your visit with [place] today, we appreciate you taking the time to fill it out.  Thanks, Gabrielle Logan, CNM, [facility]  [facility] HAS MOVED!!! It is now [facility] at [facility] ([address] [place], [place] [address]) Entrance located off of [address] Free 24/7 valet parking   Go to [email or web address] to register for FREE online childbirth classes  Industry Pediatricians/Family Doctors:  [facility] [phone]            [facility] [phone]                 [facility] [phone] (usually not accepting new patients unless you have family there already, you are always welcome to call and ask)       [facility] Department [phone]       [facility] Pediatricians/Family Doctors:   [facility]: [phone]  [facility]: [phone]  [facility]: [phone]  [facility] Doctors:   [facility]: [phone]   [facility]: [phone]  [facility] Doctors:  [facility]: [phone]    Home Blood Pressure Monitoring for Patients   Your provider has recommended that you check your blood pressure (BP) at least once a week at home. If you do not have a blood pressure cuff at home, one will be provided for you. Contact your provider if you have not received your monitor within 1 week.   Helpful Tips for Accurate Home Blood Pressure Checks  . Don't smoke, exercise, or drink caffeine 30 minutes before checking your BP . Use the restroom before checking your BP (a full bladder can raise your pressure) . Relax in a comfortable upright chair . Feet on the ground . Left arm resting comfortably on a flat surface at the level of your heart . Legs uncrossed . Back supported . Sit quietly and don't talk . Place the  cuff on your bare arm . Adjust snuggly, so that only two fingertips can fit between your skin and the top of the cuff . Check 2 readings separated by at least one minute . Keep a log of your BP readings . For a visual, please reference this diagram: http://ccnc.care/bpdiagram  Provider Name: [facility]     Phone: [phone]  Zone 1: ALL CLEAR  Continue to monitor your symptoms:  . BP reading is less than 140 (top number) or less than 90 (bottom number)  . No right upper stomach pain . No headaches or seeing spots . No feeling nauseated or throwing up . No swelling in face and hands  Zone 2: CAUTION Call your doctor's office for any of the following:  . BP reading is greater than 140 (top number) or greater than 90 (bottom number)  . Stomach pain under your ribs in the middle or right side . Headaches or seeing spots . Feeling nauseated or throwing up . Swelling in face and hands  Zone 3: EMERGENCY  Seek immediate medical care if you have any of the following:  . BP reading is greater than160 (top number) or greater than 110 (bottom number) . Severe headaches not improving with Tylenol . Serious difficulty catching your breath . Any worsening symptoms from Zone 2     Second Trimester of Pregnancy The second trimester is from week 14 through week 27 (months 4 through 6). The second trimester is  often a time when you feel your best. Your body has adjusted to being pregnant, and you begin to feel better physically. Usually, morning sickness has lessened or quit completely, you may have more energy, and you may have an increase in appetite. The second trimester is also a time when the fetus is growing rapidly. At the end of the sixth month, the fetus is about 9 inches long and weighs about 1 pounds. You will likely begin to feel the baby move (quickening) between 16 and 20 weeks of pregnancy. Body changes during your second trimester Your body continues to go through many  changes during your second trimester. The changes vary from woman to woman.  Your weight will continue to increase. You will notice your lower abdomen bulging out.  You may begin to get stretch marks on your hips, abdomen, and breasts.  You may develop headaches that can be relieved by medicines. The medicines should be approved by your health care provider.  You may urinate more often because the fetus is pressing on your bladder.  You may develop or continue to have heartburn as a result of your pregnancy.  You may develop constipation because certain hormones are causing the muscles that push waste through your intestines to slow down.  You may develop hemorrhoids or swollen, bulging veins (varicose veins).  You may have back pain. This is caused by: ? Weight gain. ? Pregnancy hormones that are relaxing the joints in your pelvis. ? A shift in weight and the muscles that support your balance.  Your breasts will continue to grow and they will continue to become tender.  Your gums may bleed and may be sensitive to brushing and flossing.  Dark spots or blotches (chloasma, mask of pregnancy) may develop on your face. This will likely fade after the baby is born.  A dark line from your belly button to the pubic area (linea nigra) may appear. This will likely fade after the baby is born.  You may have changes in your hair. These can include thickening of your hair, rapid growth, and changes in texture. Some women also have hair loss during or after pregnancy, or hair that feels dry or thin. Your hair will most likely return to normal after your baby is born.  What to expect at prenatal visits During a routine prenatal visit:  You will be weighed to make sure you and the fetus are growing normally.  Your blood pressure will be taken.  Your abdomen will be measured to track your baby's growth.  The fetal heartbeat will be listened to.  Any test results from the previous visit will  be discussed.  Your health care provider may ask you:  How you are feeling.  If you are feeling the baby move.  If you have had any abnormal symptoms, such as leaking fluid, bleeding, severe headaches, or abdominal cramping.  If you are using any tobacco products, including cigarettes, chewing tobacco, and electronic cigarettes.  If you have any questions.  Other tests that may be performed during your second trimester include:  Blood tests that check for: ? Low iron levels (anemia). ? High blood sugar that affects pregnant women (gestational diabetes) between 74 and 28 weeks. ? Rh antibodies. This is to check for a protein on red blood cells (Rh factor).  Urine tests to check for infections, diabetes, or protein in the urine.  An ultrasound to confirm the proper growth and development of the baby.  An amniocentesis to  check for possible genetic problems.  Fetal screens for spina bifida and Down syndrome.  HIV (human immunodeficiency virus) testing. Routine prenatal testing includes screening for HIV, unless you choose not to have this test.  Follow these instructions at home: Medicines  Follow your health care provider's instructions regarding medicine use. Specific medicines may be either safe or unsafe to take during pregnancy.  Take a prenatal vitamin that contains at least 600 micrograms (mcg) of folic acid.  If you develop constipation, try taking a stool softener if your health care provider approves. Eating and drinking  Eat a balanced diet that includes fresh fruits and vegetables, whole grains, good sources of protein such as meat, eggs, or tofu, and low-fat dairy. Your health care provider will help you determine the amount of weight gain that is right for you.  Avoid raw meat and uncooked cheese. These carry germs that can cause birth defects in the baby.  If you have low calcium intake from food, talk to your health care provider about whether you should take  a daily calcium supplement.  Limit foods that are high in fat and processed sugars, such as fried and sweet foods.  To prevent constipation: ? Drink enough fluid to keep your urine clear or pale yellow. ? Eat foods that are high in fiber, such as fresh fruits and vegetables, whole grains, and beans. Activity  Exercise only as directed by your health care provider. Most women can continue their usual exercise routine during pregnancy. Try to exercise for 30 minutes at least 5 days a week. Stop exercising if you experience uterine contractions.  Avoid heavy lifting, wear low heel shoes, and practice good posture.  A sexual relationship may be continued unless your health care provider directs you otherwise. Relieving pain and discomfort  Wear a good support bra to prevent discomfort from breast tenderness.  Take warm sitz baths to soothe any pain or discomfort caused by hemorrhoids. Use hemorrhoid cream if your health care provider approves.  Rest with your legs elevated if you have leg cramps or low back pain.  If you develop varicose veins, wear support hose. Elevate your feet for 15 minutes, 3-4 times a day. Limit salt in your diet. Prenatal Care  Write down your questions. Take them to your prenatal visits.  Keep all your prenatal visits as told by your health care provider. This is important. Safety  Wear your seat belt at all times when driving.  Make a list of emergency phone numbers, including numbers for family, friends, the hospital, and police and fire departments. General instructions  Ask your health care provider for a referral to a local prenatal education class. Begin classes no later than the beginning of month 6 of your pregnancy.  Ask for help if you have counseling or nutritional needs during pregnancy. Your health care provider can offer advice or refer you to specialists for help with various needs.  Do not use hot tubs, steam rooms, or saunas.  Do not  douche or use tampons or scented sanitary pads.  Do not cross your legs for long periods of time.  Avoid cat litter boxes and soil used by cats. These carry germs that can cause birth defects in the baby and possibly loss of the fetus by miscarriage or stillbirth.  Avoid all smoking, herbs, alcohol, and unprescribed drugs. Chemicals in these products can affect the formation and growth of the baby.  Do not use any products that contain nicotine or tobacco, such as  cigarettes and e-cigarettes. If you need help quitting, ask your health care provider.  Visit your dentist if you have not gone yet during your pregnancy. Use a soft toothbrush to brush your teeth and be gentle when you floss. Contact a health care provider if:  You have dizziness.  You have mild pelvic cramps, pelvic pressure, or nagging pain in the abdominal area.  You have persistent nausea, vomiting, or diarrhea.  You have a bad smelling vaginal discharge.  You have pain when you urinate. Get help right away if:  You have a fever.  You are leaking fluid from your vagina.  You have spotting or bleeding from your vagina.  You have severe abdominal cramping or pain.  You have rapid weight gain or weight loss.  You have shortness of breath with chest pain.  You notice sudden or extreme swelling of your face, hands, ankles, feet, or legs.  You have not felt your baby move in over an hour.  You have severe headaches that do not go away when you take medicine.  You have vision changes. Summary  The second trimester is from week 14 through week 27 (months 4 through 6). It is also a time when the fetus is growing rapidly.  Your body goes through many changes during pregnancy. The changes vary from woman to woman.  Avoid all smoking, herbs, alcohol, and unprescribed drugs. These chemicals affect the formation and growth your baby.  Do not use any tobacco products, such as cigarettes, chewing tobacco, and  e-cigarettes. If you need help quitting, ask your health care provider.  Contact your health care provider if you have any questions. Keep all prenatal visits as told by your health care provider. This is important. This information is not intended to replace advice given to you by your health care provider. Make sure you discuss any questions you have with your health care provider. Document Released: 07/12/2001 Document Revised: 12/24/2015 Document Reviewed: 09/18/2012 Elsevier Interactive Patient Education  2017 [employer]   Placenta Previa Placenta previa is a condition in which the placenta implants in the lower part of the uterus in pregnant women. The placenta either partially or completely covers the opening to the cervix. This is a problem because the baby must pass through the cervix during delivery. There are three types of placenta previa:  Marginal placenta previa. The placenta reaches within an inch (2.5 cm) of the cervical opening but does not cover it.  Partial placenta previa. The placenta covers part of the cervical opening.  Complete placenta previa. The placenta covers the entire cervical opening. If the previa is marginal or partial and it is diagnosed in the first half of pregnancy, the placenta may move into a normal position as the pregnancy progresses and may no longer cover the cervix. It is important to keep all prenatal visits with your health care provider so you can be more closely monitored. What are the causes? The cause of this condition is not known. What increases the risk? This condition is more likely to develop in women who:  Are carrying more than one baby (multiples).  Have an abnormally shaped uterus.  Have scars on the lining of the uterus.  Have had surgeries involving the uterus, such as a cesarean delivery.  Have delivered a baby before.  Have a history of placenta previa.  Have smoked or used cocaine during pregnancy.  Are age 45  or older during pregnancy. What are the signs or symptoms? The main  symptom of this condition is sudden, painless vaginal bleeding during the second half of pregnancy. The amount of bleeding can be very light at first, and it usually stops on its own. Heavier bleeding episodes may also happen. Some women with placenta previa may have no bleeding at all. How is this diagnosed?  This condition is diagnosed: ? From an ultrasound. This test uses sound waves to find where the placenta is located before you have any bleeding episodes. ? During a checkup after vaginal bleeding is noticed.  If you are diagnosed with a partial or complete previa, digital exams with fingers will generally be avoided. Your health care provider will still perform a speculum exam.  If you did not have an ultrasound during your pregnancy, placenta previa may not be diagnosed until bleeding occurs during labor. How is this treated? Treatment for this condition may include:  Decreased activity.  Bed rest at home or in the hospital.  Pelvic rest. Nothing is placed inside the vagina during pelvic rest. This means not having sex and not using tampons or douches.  A blood transfusion to replace blood that you have lost (maternal blood loss).  A cesarean delivery. This may be performed if: ? The bleeding is heavy and cannot be controlled. ? The placenta completely covers the cervix.  Medicines to stop premature labor or to help the baby's lungs to mature. This treatment may be used if you need delivery before your pregnancy is full-term. Your treatment will be decided based on:  How much you are bleeding, or whether the bleeding has stopped.  How far along you are in your pregnancy.  The condition of your baby.  The type of placenta previa that you have.  Follow these instructions at home:  Get plenty of rest and lessen activity as told by your health care provider.  Stay on bed rest for as long as told by your  health care provider.  Do not have sex, use tampons, use a douche, or place anything inside of your vagina if your health care provider recommended pelvic rest.  Take over-the-counter and prescription medicines as told by your health care provider.  Keep all follow-up visits as told by your health care provider. This is important. Get help right away if:  You have vaginal bleeding, even if in small amounts and even if you have no pain.  You have cramping or regular contractions.  You have pain in your abdomen or your lower back.  You have a feeling of increased pressure in your pelvis.  You have increased watery or bloody mucus from the vagina. This information is not intended to replace advice given to you by your health care provider. Make sure you discuss any questions you have with your health care provider. Document Released: 07/18/2005 Document Revised: 06/30/2017 Document Reviewed: 01/30/2016 Elsevier Patient Education  2020 [employer].

## 2019-04-23 NOTE — Progress Notes (Signed)
[place] 67+8 wks,cephalic,posterior low lying placenta gr 0,tip of placenta to cx 1.25 cm ,svp of fluid 5.2 cm,normal ovaries bilat,fhr 159 bpm,cx length 4.3 cm,efw 261 g 70%,anatomy complete

## 2019-04-25 LAB — URINE CULTURE

## 2019-04-26 LAB — INTEGRATED 2
AFP MoM: 1.11
Alpha-Fetoprotein: 39.3 ng/mL
Crown Rump Length: 63.6 mm
DIA MoM: 1.42
DIA Value: 200.8 pg/mL
Estriol, Unconjugated: 1.29 ng/mL
Gest. Age on Collection Date: 12.6 weeks
Gestational Age: 18.6 weeks
Maternal Age at EDD: 35.4 yr
Nuchal Translucency (NT): 1.6 mm
Nuchal Translucency MoM: 1.12
Number of Fetuses: 1
PAPP-A MoM: 1.48
PAPP-A Value: 1142.3 ng/mL
Test Results:: NEGATIVE
Weight: 199 [lb_av]
Weight: 201 [lb_av]
hCG MoM: 1.32
hCG Value: 25.8 IU/mL
uE3 MoM: 0.85

## 2019-04-29 ENCOUNTER — Encounter: Payer: Self-pay | Admitting: *Deleted

## 2019-05-21 ENCOUNTER — Encounter: Payer: Self-pay | Admitting: Women's Health

## 2019-05-21 ENCOUNTER — Other Ambulatory Visit: Payer: Self-pay

## 2019-05-21 ENCOUNTER — Telehealth (INDEPENDENT_AMBULATORY_CARE_PROVIDER_SITE_OTHER): Payer: Medicaid Other | Admitting: Women's Health

## 2019-05-21 VITALS — BP 103/78 | HR 77 | Wt 204.0 lb

## 2019-05-21 DIAGNOSIS — Z3482 Encounter for supervision of other normal pregnancy, second trimester: Secondary | ICD-10-CM

## 2019-05-21 DIAGNOSIS — O4440 Low lying placenta NOS or without hemorrhage, unspecified trimester: Secondary | ICD-10-CM

## 2019-05-21 DIAGNOSIS — Z3A22 22 weeks gestation of pregnancy: Secondary | ICD-10-CM

## 2019-05-21 DIAGNOSIS — O4442 Low lying placenta NOS or without hemorrhage, second trimester: Secondary | ICD-10-CM

## 2019-05-21 NOTE — Patient Instructions (Signed)
Gabrielle Logan, I greatly value your feedback.  If you receive a survey following your visit with [place] today, we appreciate you taking the time to fill it out.  Thanks, Knute Neu, CNM, WHNP-BC   You will have your sugar test next visit.  Please do not eat or drink anything after midnight the night before you come, not even water.  You will be here for at least two hours.  Please make an appointment online for the bloodwork at [email or web address] for 8:30am (or as close to this as possible). Make sure you select the [facility] service center. The day of the appointment, check in with our office first, then you will go to [place] to start the sugar test.    [place]!!! It is now [place] at [facility] ([place], [place] [address]) Entrance located off of [address] parking  Go to [email or web address] to register for FREE online childbirth classes   Call the office [phone]) or go to [facility] if:  You begin to have strong, frequent contractions  Your water breaks.  Sometimes it is a big gush of fluid, sometimes it is just a trickle that keeps getting your panties wet or running down your legs  You have vaginal bleeding.  It is normal to have a small amount of spotting if your cervix was checked.   You don't feel your baby moving like normal.  If you don't, get you something to eat and drink and lay down and focus on feeling your baby move.   If your baby is still not moving like normal, you should call the office or go to [place] [facility]/Family Doctors:  [place] [phone]                 [place] [phone] (usually not accepting new patients unless you have family there already, you are always welcome to call and ask)       [facility] Department [phone]       [facility] Pediatricians/Family  Doctors:   [facility]: [phone]  [facility]: [phone]  [facility]: [place] Doctors:   [facility]: [facility]: [place]:  [place]: [phone]   Home Blood Pressure Monitoring for Patients   Your provider has recommended that you check your blood pressure (BP) at least once a week at home. If you do not have a blood pressure cuff at home, one will be provided for you. Contact your provider if you have not received your monitor within 1 week.   Helpful Tips for Accurate Home Blood Pressure Checks  . Don't smoke, exercise, or drink caffeine 30 minutes before checking your BP . Use the restroom before checking your BP (a full bladder can raise your pressure) . Relax in a comfortable upright chair . Feet on the ground . Left arm resting comfortably on a flat surface at the level of your heart . Legs uncrossed . Back supported . Sit quietly and don't talk . Place the cuff on your bare arm . Adjust snuggly, so that only two fingertips can fit between your skin and the top of the cuff . Check 2 readings separated by at least one minute . Keep a log of your BP  readings . For a visual, please reference this diagram: http://ccnc.care/bpdiagram  Provider Name: [facility]     Phone: [phone]  Zone 1: ALL CLEAR  Continue to monitor your symptoms:  . BP reading is less than 140 (top number) or less than 90 (bottom number)  . No right upper stomach pain . No headaches or seeing spots . No feeling nauseated or throwing up . No swelling in face and hands  Zone 2: CAUTION Call your doctor's office for any of the following:  . BP reading is greater than 140 (top number) or greater than 90 (bottom number)  . Stomach pain under your ribs in the middle or right side . Headaches or seeing spots . Feeling  nauseated or throwing up . Swelling in face and hands  Zone 3: EMERGENCY  Seek immediate medical care if you have any of the following:  . BP reading is greater than160 (top number) or greater than 110 (bottom number) . Severe headaches not improving with Tylenol . Serious difficulty catching your breath . Any worsening symptoms from Zone 2   Second Trimester of Pregnancy The second trimester is from week 13 through week 28, months 4 through 6. The second trimester is often a time when you feel your best. Your body has also adjusted to being pregnant, and you begin to feel better physically. Usually, morning sickness has lessened or quit completely, you may have more energy, and you may have an increase in appetite. The second trimester is also a time when the fetus is growing rapidly. At the end of the sixth month, the fetus is about 9 inches long and weighs about 1 pounds. You will likely begin to feel the baby move (quickening) between 18 and 20 weeks of the pregnancy. BODY CHANGES Your body goes through many changes during pregnancy. The changes vary from woman to woman.   Your weight will continue to increase. You will notice your lower abdomen bulging out.  You may begin to get stretch marks on your hips, abdomen, and breasts.  You may develop headaches that can be relieved by medicines approved by your health care provider.  You may urinate more often because the fetus is pressing on your bladder.  You may develop or continue to have heartburn as a result of your pregnancy.  You may develop constipation because certain hormones are causing the muscles that push waste through your intestines to slow down.  You may develop hemorrhoids or swollen, bulging veins (varicose veins).  You may have back pain because of the weight gain and pregnancy hormones relaxing your joints between the bones in your pelvis and as a result of a shift in weight and the muscles that support your  balance.  Your breasts will continue to grow and be tender.  Your gums may bleed and may be sensitive to brushing and flossing.  Dark spots or blotches (chloasma, mask of pregnancy) may develop on your face. This will likely fade after the baby is born.  A dark line from your belly button to the pubic area (linea nigra) may appear. This will likely fade after the baby is born.  You may have changes in your hair. These can include thickening of your hair, rapid growth, and changes in texture. Some women also have hair loss during or after pregnancy, or hair that feels dry or thin. Your hair will most likely return to normal after your baby is born. WHAT TO EXPECT AT YOUR PRENATAL VISITS During  a routine prenatal visit:  You will be weighed to make sure you and the fetus are growing normally.  Your blood pressure will be taken.  Your abdomen will be measured to track your baby's growth.  The fetal heartbeat will be listened to.  Any test results from the previous visit will be discussed. Your health care provider may ask you:  How you are feeling.  If you are feeling the baby move.  If you have had any abnormal symptoms, such as leaking fluid, bleeding, severe headaches, or abdominal cramping.  If you have any questions. Other tests that may be performed during your second trimester include:  Blood tests that check for:  Low iron levels (anemia).  Gestational diabetes (between 24 and 28 weeks).  Rh antibodies.  Urine tests to check for infections, diabetes, or protein in the urine.  An ultrasound to confirm the proper growth and development of the baby.  An amniocentesis to check for possible genetic problems.  Fetal screens for spina bifida and Down syndrome. HOME CARE INSTRUCTIONS   Avoid all smoking, herbs, alcohol, and unprescribed drugs. These chemicals affect the formation and growth of the baby.  Follow your health care provider's instructions regarding  medicine use. There are medicines that are either safe or unsafe to take during pregnancy.  Exercise only as directed by your health care provider. Experiencing uterine cramps is a good sign to stop exercising.  Continue to eat regular, healthy meals.  Wear a good support bra for breast tenderness.  Do not use hot tubs, steam rooms, or saunas.  Wear your seat belt at all times when driving.  Avoid raw meat, uncooked cheese, cat litter boxes, and soil used by cats. These carry germs that can cause birth defects in the baby.  Take your prenatal vitamins.  Try taking a stool softener (if your health care provider approves) if you develop constipation. Eat more high-fiber foods, such as fresh vegetables or fruit and whole grains. Drink plenty of fluids to keep your urine clear or pale yellow.  Take warm sitz baths to soothe any pain or discomfort caused by hemorrhoids. Use hemorrhoid cream if your health care provider approves.  If you develop varicose veins, wear support hose. Elevate your feet for 15 minutes, 3-4 times a day. Limit salt in your diet.  Avoid heavy lifting, wear low heel shoes, and practice good posture.  Rest with your legs elevated if you have leg cramps or low back pain.  Visit your dentist if you have not gone yet during your pregnancy. Use a soft toothbrush to brush your teeth and be gentle when you floss.  A sexual relationship may be continued unless your health care provider directs you otherwise.  Continue to go to all your prenatal visits as directed by your health care provider. SEEK MEDICAL CARE IF:   You have dizziness.  You have mild pelvic cramps, pelvic pressure, or nagging pain in the abdominal area.  You have persistent nausea, vomiting, or diarrhea.  You have a bad smelling vaginal discharge.  You have pain with urination. SEEK IMMEDIATE MEDICAL CARE IF:   You have a fever.  You are leaking fluid from your vagina.  You have spotting or  bleeding from your vagina.  You have severe abdominal cramping or pain.  You have rapid weight gain or loss.  You have shortness of breath with chest pain.  You notice sudden or extreme swelling of your face, hands, ankles, feet, or legs.  You  have not felt your baby move in over an hour.  You have severe headaches that do not go away with medicine.  You have vision changes. Document Released: 07/12/2001 Document Revised: 07/23/2013 Document Reviewed: 09/18/2012 [facility] Patient Information 2015 [place], [place]. This information is not intended to replace advice given to you by your health care provider. Make sure you discuss any questions you have with your health care provider.

## 2019-05-21 NOTE — Progress Notes (Signed)
   TELEHEALTH VIRTUAL OBSTETRICS VISIT ENCOUNTER NOTE Patient name: Gabrielle Logan MRN [id number]  Date of birth: 1985-11-10  I connected with patient on 05/21/19 at  2:10 PM EDT by MyChart video  and verified that I am speaking with the correct person using two identifiers. Due to COVID-19 recommendations, pt is not currently in our office.    I discussed the limitations, risks, security and privacy concerns of performing an evaluation and management service by telephone and the availability of in person appointments. I also discussed with the patient that there may be a patient responsible charge related to this service. The patient expressed understanding and agreed to proceed.  Chief Complaint:   Routine Prenatal Visit  History of Present Illness:   Gabrielle Logan is a 33 y.o. [phone] female at [redacted]w[redacted]d with an Estimated Date of Delivery: 09/21/19 being evaluated today for ongoing management of a low-risk pregnancy.  Today she reports some low back pressure intermittently, umbilical hernia, doesn't really bother her. Contractions: Not present.  .  Movement: Present. denies leaking of fluid. Review of Systems:   Pertinent items are noted in HPI Denies abnormal vaginal discharge w/ itching/odor/irritation, headaches, visual changes, shortness of breath, chest pain, abdominal pain, severe nausea/vomiting, or problems with urination or bowel movements unless otherwise stated above. Pertinent History Reviewed:  Reviewed past medical,surgical, social, obstetrical and family history.  Reviewed problem list, medications and allergies. Physical Assessment:   Vitals:   05/21/19 1426  BP: 103/78  Pulse: 77  Weight: 204 lb (92.5 kg)  Body mass index is 35.85 kg/m.        Physical Examination:   General:  Alert, oriented and cooperative.   Mental Status: Normal mood and affect perceived. Normal judgment and thought content.  Rest of physical exam deferred due to type of encounter  No  results found for this or any previous visit (from the past 24 hour(s)).  Assessment & Plan:  1) Pregnancy [id number] at [redacted]w[redacted]d with an Estimated Date of Delivery: 09/21/19   2) Prev c/s x 3, for RCS  3) Posterior low-lying placenta> u/s next visit, continue pelvic rest  4) Umbilical hernia> will assess at next visit   Meds: No orders of the defined types were placed in this encounter.  Labs/procedures today: none  Plan:  Continue routine obstetrical care .  Has home bp cuff.  Check bp weekly, let us know if >140/90.  Next visit: prefers will be in person for pn2, u/s    Reviewed: Preterm labor symptoms and general obstetric precautions including but not limited to vaginal bleeding, contractions, leaking of fluid and fetal movement were reviewed in detail with the patient. The patient was advised to call back or seek an in-person office evaluation/go to [facility] at [facility] for any urgent or concerning symptoms. All questions were answered. Please refer to After Visit Summary for other counseling recommendations.    I provided 15 minutes of non-face-to-face time during this encounter.  Follow-up: Return in about 4 weeks (around 06/18/2019) for LROB, US:OB F/U placenta, PN2, CNM, in person.  Orders Placed This Encounter  Procedures  . US OB Limited   Kimberly R Booker CNM, [facility] 05/21/2019 2:47 PM

## 2019-06-20 ENCOUNTER — Ambulatory Visit (INDEPENDENT_AMBULATORY_CARE_PROVIDER_SITE_OTHER): Payer: Medicaid Other | Admitting: Advanced Practice Midwife

## 2019-06-20 ENCOUNTER — Ambulatory Visit (INDEPENDENT_AMBULATORY_CARE_PROVIDER_SITE_OTHER): Payer: Medicaid Other

## 2019-06-20 ENCOUNTER — Encounter: Payer: Self-pay | Admitting: Advanced Practice Midwife

## 2019-06-20 ENCOUNTER — Other Ambulatory Visit: Payer: Medicaid Other

## 2019-06-20 ENCOUNTER — Other Ambulatory Visit: Payer: Self-pay

## 2019-06-20 VITALS — BP 126/97 | HR 108

## 2019-06-20 DIAGNOSIS — O4440 Low lying placenta NOS or without hemorrhage, unspecified trimester: Secondary | ICD-10-CM

## 2019-06-20 DIAGNOSIS — O4442 Low lying placenta NOS or without hemorrhage, second trimester: Secondary | ICD-10-CM

## 2019-06-20 DIAGNOSIS — Z3A26 26 weeks gestation of pregnancy: Secondary | ICD-10-CM

## 2019-06-20 DIAGNOSIS — Z331 Pregnant state, incidental: Secondary | ICD-10-CM

## 2019-06-20 DIAGNOSIS — Z3492 Encounter for supervision of normal pregnancy, unspecified, second trimester: Secondary | ICD-10-CM

## 2019-06-20 DIAGNOSIS — Z1389 Encounter for screening for other disorder: Secondary | ICD-10-CM

## 2019-06-20 DIAGNOSIS — Z3482 Encounter for supervision of other normal pregnancy, second trimester: Secondary | ICD-10-CM

## 2019-06-20 LAB — POCT URINALYSIS DIPSTICK OB
Blood, UA: NEGATIVE
Glucose, UA: NEGATIVE
Ketones, UA: NEGATIVE
Leukocytes, UA: NEGATIVE
Nitrite, UA: NEGATIVE

## 2019-06-20 NOTE — Progress Notes (Addendum)
   PRENATAL VISIT NOTE  Subjective:  Gabrielle Logan is a 33 y.o. [id number] at [redacted]w[redacted]d being seen today for ongoing prenatal care.  She is currently monitored for the following issues for this low-risk pregnancy and has History of C-section; Family history of Down syndrome; Family history of Wolff-Parkinson-White (WPW) syndrome; Supervision of normal pregnancy; Family history of gastroschisis; and Low-lying placenta on their problem list.  Patient reports no complaints.  Contractions: Not present. Vag. Bleeding: None.  Movement: Present. Denies leaking of fluid.   The following portions of the patient's history were reviewed and updated as appropriate: allergies, current medications, past family history, past medical history, past social history, past surgical history and problem list.   Objective:   Vitals:   06/20/19 0853  BP: (!) 126/97  Pulse: (!) 108    Fetal Status: Fetal Heart Rate (bpm): 143 Fundal Height: 27 cm Movement: Present     [place] 26+5 wks,breech,posterior placenta,tip of placenta to cx 3.6 cm,cx 3.5 cm,afi 13.7 cm,normal ovaries bilat,fhr 150 bpm,efw 1071 g 66%  General:  Alert, oriented and cooperative. Patient is in no acute distress.  Skin: Skin is warm and dry. No rash noted.   Cardiovascular: Normal heart rate noted  Respiratory: Normal respiratory effort, no problems with respiration noted  Abdomen: Soft, gravid, appropriate for gestational age. Diastasis recti, not painful.   Pain/Pressure: Absent     Pelvic: Cervical exam deferred        Extremities: Normal range of motion.     Mental Status: Normal mood and affect. Normal behavior. Normal judgment and thought content.   Assessment and Plan:  Pregnancy: [id number] at [redacted]w[redacted]d 1. Encounter for supervision of low-risk pregnancy in second trimester - Pt doing well. No complaints today.  - Pt unable to complete GTT d/t patient vomiting after drinking Glucola.  - Discussed redoing GTT with 26 oz of Sprite vs checking  CBGs for 2 weeks. Pt prefers to drink Sprite. Pt will schedule that with lab prior to 28 weeks - Low lying placenta. resolved - Pt to follow up in 3 weeks for routine prenatal care; PN2 rescheduled for after Thanksgiving.  Preterm labor symptoms and general obstetric precautions including but not limited to vaginal bleeding, contractions, leaking of fluid and fetal movement were reviewed in detail with the patient. Please refer to After Visit Summary for other counseling recommendations.    Future Appointments  Date Time Provider [place]  07/04/2019  8:30 AM [facility]  07/18/2019  2:30 PM Eure, Mertie Clause, MD [facility]    Maryagnes Amos, SNM

## 2019-06-20 NOTE — Progress Notes (Signed)
[place] 80+2 wks,breech,posterior placenta,tip of placenta to cx 3.6 cm,cx 3.5 cm,afi 13.7 cm,normal ovaries bilat,fhr 150 bpm,efw 1071 g 66%

## 2019-06-20 NOTE — Patient Instructions (Signed)
Gabrielle Logan, I greatly value your feedback.  If you receive a survey following your visit with [place] today, we appreciate you taking the time to fill it out.  Thanks, Cathie Beams, CNM   [facility] HAS MOVED!!! It is now [facility] at [facility] ([address] [place], [place] [address]) Entrance located off of [address] Free 24/7 valet parking   Go to [email or web address] to register for FREE online childbirth classes    Call the office [phone]) or go to [facility] if:  You begin to have strong, frequent contractions  Your water breaks.  Sometimes it is a big gush of fluid, sometimes it is just a trickle that keeps getting your panties wet or running down your legs  You have vaginal bleeding.  It is normal to have a small amount of spotting if your cervix was checked.   You don't feel your baby moving like normal.  If you don't, get you something to eat and drink and lay down and focus on feeling your baby move.  You should feel at least 10 movements in 2 hours.  If you don't, you should call the office or go to [facility].    Tdap Vaccine  It is recommended that you get the Tdap vaccine during the third trimester of EACH pregnancy to help protect your baby from getting pertussis (whooping cough)  27-36 weeks is the BEST time to do this so that you can pass the protection on to your baby. During pregnancy is better than after pregnancy, but if you are unable to get it during pregnancy it will be offered at the hospital.   You will be offered this vaccine in the office after 27 weeks. If you do not have health insurance, you can get this vaccine at the health department or your family doctor  Everyone who will be around your baby should also be up-to-date on their vaccines. Adults (who are not pregnant) only need 1 dose of Tdap during adulthood.   Third Trimester of Pregnancy The third trimester is from week 29 through week 42, months 7 through  9. The third trimester is a time when the fetus is growing rapidly. At the end of the ninth month, the fetus is about 20 inches in length and weighs 6-10 pounds.  BODY CHANGES Your body goes through many changes during pregnancy. The changes vary from woman to woman.   Your weight will continue to increase. You can expect to gain 25-35 pounds (11-16 kg) by the end of the pregnancy.  You may begin to get stretch marks on your hips, abdomen, and breasts.  You may urinate more often because the fetus is moving lower into your pelvis and pressing on your bladder.  You may develop or continue to have heartburn as a result of your pregnancy.  You may develop constipation because certain hormones are causing the muscles that push waste through your intestines to slow down.  You may develop hemorrhoids or swollen, bulging veins (varicose veins).  You may have pelvic pain because of the weight gain and pregnancy hormones relaxing your joints between the bones in your pelvis. Backaches may result from overexertion of the muscles supporting your posture.  You may have changes in your hair. These can include thickening of your hair, rapid growth, and changes in texture. Some women also have hair loss during or after pregnancy, or hair that feels dry or thin. Your hair will most likely return to  normal after your baby is born.  Your breasts will continue to grow and be tender. A yellow discharge may leak from your breasts called colostrum.  Your belly button may stick out.  You may feel short of breath because of your expanding uterus.  You may notice the fetus "dropping," or moving lower in your abdomen.  You may have a bloody mucus discharge. This usually occurs a few days to a week before labor begins.  Your cervix becomes thin and soft (effaced) near your due date. WHAT TO EXPECT AT YOUR PRENATAL EXAMS  You will have prenatal exams every 2 weeks until week 36. Then, you will have weekly  prenatal exams. During a routine prenatal visit:  You will be weighed to make sure you and the fetus are growing normally.  Your blood pressure is taken.  Your abdomen will be measured to track your baby's growth.  The fetal heartbeat will be listened to.  Any test results from the previous visit will be discussed.  You may have a cervical check near your due date to see if you have effaced. At around 36 weeks, your caregiver will check your cervix. At the same time, your caregiver will also perform a test on the secretions of the vaginal tissue. This test is to determine if a type of bacteria, Group B streptococcus, is present. Your caregiver will explain this further. Your caregiver may ask you:  What your birth plan is.  How you are feeling.  If you are feeling the baby move.  If you have had any abnormal symptoms, such as leaking fluid, bleeding, severe headaches, or abdominal cramping.  If you have any questions. Other tests or screenings that may be performed during your third trimester include:  Blood tests that check for low iron levels (anemia).  Fetal testing to check the health, activity level, and growth of the fetus. Testing is done if you have certain medical conditions or if there are problems during the pregnancy. FALSE LABOR You may feel small, irregular contractions that eventually go away. These are called Braxton Hicks contractions, or false labor. Contractions may last for hours, days, or even weeks before true labor sets in. If contractions come at regular intervals, intensify, or become painful, it is best to be seen by your caregiver.  SIGNS OF LABOR   Menstrual-like cramps.  Contractions that are 5 minutes apart or less.  Contractions that start on the top of the uterus and spread down to the lower abdomen and back.  A sense of increased pelvic pressure or back pain.  A watery or bloody mucus discharge that comes from the vagina. If you have any of  these signs before the 37th week of pregnancy, call your caregiver right away. You need to go to the hospital to get checked immediately. HOME CARE INSTRUCTIONS   Avoid all smoking, herbs, alcohol, and unprescribed drugs. These chemicals affect the formation and growth of the baby.  Follow your caregiver's instructions regarding medicine use. There are medicines that are either safe or unsafe to take during pregnancy.  Exercise only as directed by your caregiver. Experiencing uterine cramps is a good sign to stop exercising.  Continue to eat regular, healthy meals.  Wear a good support bra for breast tenderness.  Do not use hot tubs, steam rooms, or saunas.  Wear your seat belt at all times when driving.  Avoid raw meat, uncooked cheese, cat litter boxes, and soil used by cats. These carry germs that can  cause birth defects in the baby.  Take your prenatal vitamins.  Try taking a stool softener (if your caregiver approves) if you develop constipation. Eat more high-fiber foods, such as fresh vegetables or fruit and whole grains. Drink plenty of fluids to keep your urine clear or pale yellow.  Take warm sitz baths to soothe any pain or discomfort caused by hemorrhoids. Use hemorrhoid cream if your caregiver approves.  If you develop varicose veins, wear support hose. Elevate your feet for 15 minutes, 3-4 times a day. Limit salt in your diet.  Avoid heavy lifting, wear low heal shoes, and practice good posture.  Rest a lot with your legs elevated if you have leg cramps or low back pain.  Visit your dentist if you have not gone during your pregnancy. Use a soft toothbrush to brush your teeth and be gentle when you floss.  A sexual relationship may be continued unless your caregiver directs you otherwise.  Do not travel far distances unless it is absolutely necessary and only with the approval of your caregiver.  Take prenatal classes to understand, practice, and ask questions about  the labor and delivery.  Make a trial run to the hospital.  Pack your hospital bag.  Prepare the baby's nursery.  Continue to go to all your prenatal visits as directed by your caregiver. SEEK MEDICAL CARE IF:  You are unsure if you are in labor or if your water has broken.  You have dizziness.  You have mild pelvic cramps, pelvic pressure, or nagging pain in your abdominal area.  You have persistent nausea, vomiting, or diarrhea.  You have a bad smelling vaginal discharge.  You have pain with urination. SEEK IMMEDIATE MEDICAL CARE IF:   You have a fever.  You are leaking fluid from your vagina.  You have spotting or bleeding from your vagina.  You have severe abdominal cramping or pain.  You have rapid weight loss or gain.  You have shortness of breath with chest pain.  You notice sudden or extreme swelling of your face, hands, ankles, feet, or legs.  You have not felt your baby move in over an hour.  You have severe headaches that do not go away with medicine.  You have vision changes. Document Released: 07/12/2001 Document Revised: 07/23/2013 Document Reviewed: 09/18/2012 [facility] Patient Information 2015 [place], [place]. This information is not intended to replace advice given to you by your health care provider. Make sure you discuss any questions you have with your health care provider.

## 2019-06-21 ENCOUNTER — Other Ambulatory Visit: Payer: Self-pay | Admitting: *Deleted

## 2019-06-21 DIAGNOSIS — Z3482 Encounter for supervision of other normal pregnancy, second trimester: Secondary | ICD-10-CM

## 2019-06-21 DIAGNOSIS — Z3A26 26 weeks gestation of pregnancy: Secondary | ICD-10-CM

## 2019-06-21 LAB — CBC
Hematocrit: 42.1 % (ref 34.0–46.6)
Hemoglobin: 14 g/dL (ref 11.1–15.9)
MCH: 30.1 pg (ref 26.6–33.0)
MCHC: 33.3 g/dL (ref 31.5–35.7)
MCV: 91 fL (ref 79–97)
Platelets: 260 10*3/uL (ref 150–450)
RBC: 4.65 x10E6/uL (ref 3.77–5.28)
RDW: 12.3 % (ref 11.7–15.4)
WBC: 18.1 10*3/uL — ABNORMAL HIGH (ref 3.4–10.8)

## 2019-06-21 LAB — ANTIBODY SCREEN: Antibody Screen: NEGATIVE

## 2019-06-21 LAB — HIV ANTIBODY (ROUTINE TESTING W REFLEX): HIV Screen 4th Generation wRfx: NONREACTIVE

## 2019-06-21 LAB — RPR: RPR Ser Ql: NONREACTIVE

## 2019-06-21 MED ORDER — ACCU-CHEK GUIDE ME W/DEVICE KIT: 1.0000 | PACK | Freq: Four times a day (QID) | 0 refills | Status: DC

## 2019-06-21 MED ORDER — ACCU-CHEK FASTCLIX LANCETS MISC: 1.0000 | Freq: Four times a day (QID) | 12 refills | Status: DC

## 2019-06-21 MED ORDER — ACCU-CHEK GUIDE VI STRP: ORAL_STRIP | 12 refills | Status: DC

## 2019-07-04 ENCOUNTER — Other Ambulatory Visit: Payer: Medicaid Other

## 2019-07-18 ENCOUNTER — Encounter: Payer: Medicaid Other | Admitting: Obstetrics & Gynecology

## 2019-07-19 ENCOUNTER — Encounter: Payer: Medicaid Other | Admitting: Obstetrics & Gynecology

## 2019-07-23 ENCOUNTER — Encounter: Payer: Medicaid Other | Admitting: Advanced Practice Midwife

## 2019-07-24 ENCOUNTER — Encounter: Payer: Medicaid Other | Admitting: Obstetrics and Gynecology

## 2019-07-29 ENCOUNTER — Encounter: Payer: Self-pay | Admitting: *Deleted

## 2019-07-30 ENCOUNTER — Telehealth (INDEPENDENT_AMBULATORY_CARE_PROVIDER_SITE_OTHER): Payer: Medicaid Other | Admitting: Women's Health

## 2019-07-30 ENCOUNTER — Encounter: Payer: Self-pay | Admitting: Women's Health

## 2019-07-30 VITALS — BP 122/69 | HR 88 | Ht 63.0 in | Wt 206.0 lb

## 2019-07-30 DIAGNOSIS — Z3A32 32 weeks gestation of pregnancy: Secondary | ICD-10-CM

## 2019-07-30 DIAGNOSIS — Z3483 Encounter for supervision of other normal pregnancy, third trimester: Secondary | ICD-10-CM

## 2019-07-30 NOTE — Progress Notes (Signed)
TELEHEALTH VIRTUAL OBSTETRICS VISIT ENCOUNTER NOTE Patient name: Gabrielle Logan MRN [id number]  Date of birth: 02-May-1986  I connected with patient on 07/30/19 at 11:10 AM EST by MyChart video  and verified that I am speaking with the correct person using two identifiers. Due to COVID-19 recommendations, pt is not currently in our office.    I discussed the limitations, risks, security and privacy concerns of performing an evaluation and management service by telephone and the availability of in person appointments. I also discussed with the patient that there may be a patient responsible charge related to this service. The patient expressed understanding and agreed to proceed.  Chief Complaint:   Routine Prenatal Visit (tired)  History of Present Illness:   Gabrielle Logan is a 33 y.o. [phone] female at [redacted]w[redacted]d with an Estimated Date of Delivery: 09/21/19 being evaluated today for ongoing management of a low-risk pregnancy.  Today she reports checked sugars x 2wks d/t vomiting GTT and not wanting to repeat, did have a few fastings that were >95 but pt states she gets up in the middle of the night and drinks milk for her reflux b/c her boyfriend is not comfortable w/ her taking protonix. Only had 1 2hr pp >120. Undecided if she wants BTL w/ RCS or not, still thinking about it. Contractions: Not present.  .  Movement: Present. denies leaking of fluid. Review of Systems:   Pertinent items are noted in HPI Denies abnormal vaginal discharge w/ itching/odor/irritation, headaches, visual changes, shortness of breath, chest pain, abdominal pain, severe nausea/vomiting, or problems with urination or bowel movements unless otherwise stated above. Pertinent History Reviewed:  Reviewed past medical,surgical, social, obstetrical and family history.  Reviewed problem list, medications and allergies. Physical Assessment:   Vitals:   07/30/19 1121  BP: 122/69  Pulse: 88  Weight: 206 lb (93.4 kg)    Height: 5\' 3"  (1.6 m)  Body mass index is 36.49 kg/m.        Physical Examination:   General:  Alert, oriented and cooperative.   Mental Status: Normal mood and affect perceived. Normal judgment and thought content.  Rest of physical exam deferred due to type of encounter  No results found for this or any previous visit (from the past 24 hour(s)).  Assessment & Plan:  1) Pregnancy at [redacted]w[redacted]d with an Estimated Date of Delivery: 09/21/19   2) Prev c/s x3, for RCS  3) Thinking about BTL> needs to sign consent ASAP  4) Vomited GTT> checked QID sugars x 2 wks, some FBS >95 but was drinking milk in middle of night. To stop drinking milk in middle of night, continue checking QID sugars x 2wks, send me snapshot in 1wk via mychart.   Meds: No orders of the defined types were placed in this encounter.   Labs/procedures today: none  Plan:  Continue routine obstetrical care.  Has home bp cuff.  Check bp weekly, let 09/23/19 know if >140/90.  Next visit: prefers in person to sign BTL papers, review sugars, schedule RCS  Reviewed: Preterm labor symptoms and general obstetric precautions including but not limited to vaginal bleeding, contractions, leaking of fluid and fetal movement were reviewed in detail with the patient. The patient was advised to call back or seek an in-person office evaluation/go to [facility] at [facility] for any urgent or concerning symptoms. All questions were answered. Please refer to After Visit Summary for other counseling recommendations.    I provided 15 minutes of  non-face-to-face time during this encounter.  Follow-up: Return in about 2 weeks (around 08/13/2019) for LROB, in person, MD, sign BTL papers, check sugars, tdap/flu shot, schedule c/s.  No orders of the defined types were placed in this encounter.  [place], [facility] 07/30/2019 12:06 PM

## 2019-07-30 NOTE — Patient Instructions (Signed)
Gabrielle Logan, I greatly value your feedback.  If you receive a survey following your visit with [place] today, we appreciate you taking the time to fill it out.  Thanks, Knute Neu, CNM, [facility]  [place]!!! It is now [place] at [facility] ([place],  [address]) Entrance located off of [place] parking   Go to [email or web address] to register for FREE online childbirth classes   Check blood sugars 4 times a day: in the morning before eating/drinking anything (<95) and 2 hours after your first bite of breakfast, lunch, and supper (<120).     Call the office [phone]) or go to [facility] if:  You begin to have strong, frequent contractions  Your water breaks.  Sometimes it is a big gush of fluid, sometimes it is just a trickle that keeps getting your panties wet or running down your legs  You have vaginal bleeding.  It is normal to have a small amount of spotting if your cervix was checked.   You don't feel your baby moving like normal.  If you don't, get you something to eat and drink and lay down and focus on feeling your baby move.  You should feel at least 10 movements in 2 hours.  If you don't, you should call the office or go to [place] Blood Pressure Monitoring for Patients   Your provider has recommended that you check your blood pressure (BP) at least once a week at home. If you do not have a blood pressure cuff at home, one will be provided for you. Contact your provider if you have not received your monitor within 1 week.   Helpful Tips for Accurate Home Blood Pressure Checks  . Don't smoke, exercise, or drink caffeine 30 minutes before checking your BP . Use the restroom before checking your BP (a full bladder can raise your pressure) . Relax in a comfortable upright chair . Feet on the ground . Left arm resting comfortably on a flat surface at the level of your heart .  Legs uncrossed . Back supported . Sit quietly and don't talk . Place the cuff on your bare arm . Adjust snuggly, so that only two fingertips can fit between your skin and the top of the cuff . Check 2 readings separated by at least one minute . Keep a log of your BP readings . For a visual, please reference this diagram: http://ccnc.care/bpdiagram  Provider Name: [facility]     Phone: [phone]  Zone 1: ALL CLEAR  Continue to monitor your symptoms:  . BP reading is less than 140 (top number) or less than 90 (bottom number)  . No right upper stomach pain . No headaches or seeing spots . No feeling nauseated or throwing up . No swelling in face and hands  Zone 2: CAUTION Call your doctor's office for any of the following:  . BP reading is greater than 140 (top number) or greater than 90 (bottom number)  . Stomach pain under your ribs in the middle or right side . Headaches or seeing spots . Feeling nauseated or throwing up . Swelling in face and hands  Zone 3: EMERGENCY  Seek immediate medical care if you have any of the following:  . BP reading is greater than160 (top number) or greater than 110 (bottom number) . Severe headaches not improving with Tylenol . Serious difficulty catching your breath .  Any worsening symptoms from Zone 2    Preterm Labor and Birth Information  The normal length of a pregnancy is 39-41 weeks. Preterm labor is when labor starts before 37 completed weeks of pregnancy. What are the risk factors for preterm labor? Preterm labor is more likely to occur in women who:  Have certain infections during pregnancy such as a bladder infection, sexually transmitted infection, or infection inside the uterus (chorioamnionitis).  Have a shorter-than-normal cervix.  Have gone into preterm labor before.  Have had surgery on their cervix.  Are younger than age 31 or older than age 26.  Are African American.  Are pregnant with twins or multiple  babies (multiple gestation).  Take street drugs or smoke while pregnant.  Do not gain enough weight while pregnant.  Became pregnant shortly after having been pregnant. What are the symptoms of preterm labor? Symptoms of preterm labor include:  Cramps similar to those that can happen during a menstrual period. The cramps may happen with diarrhea.  Pain in the abdomen or lower back.  Regular uterine contractions that may feel like tightening of the abdomen.  A feeling of increased pressure in the pelvis.  Increased watery or bloody mucus discharge from the vagina.  Water breaking (ruptured amniotic sac). Why is it important to recognize signs of preterm labor? It is important to recognize signs of preterm labor because babies who are born prematurely may not be fully developed. This can put them at an increased risk for:  Long-term (chronic) heart and lung problems.  Difficulty immediately after birth with regulating body systems, including blood sugar, body temperature, heart rate, and breathing rate.  Bleeding in the brain.  Cerebral palsy.  Learning difficulties.  Death. These risks are highest for babies who are born before 34 weeks of pregnancy. How is preterm labor treated? Treatment depends on the length of your pregnancy, your condition, and the health of your baby. It may involve:  Having a stitch (suture) placed in your cervix to prevent your cervix from opening too early (cerclage).  Taking or being given medicines, such as: ? Hormone medicines. These may be given early in pregnancy to help support the pregnancy. ? Medicine to stop contractions. ? Medicines to help mature the baby's lungs. These may be prescribed if the risk of delivery is high. ? Medicines to prevent your baby from developing cerebral palsy. If the labor happens before 34 weeks of pregnancy, you may need to stay in the hospital. What should I do if I think I am in preterm labor? If you think  that you are going into preterm labor, call your health care provider right away. How can I prevent preterm labor in future pregnancies? To increase your chance of having a full-term pregnancy:  Do not use any tobacco products, such as cigarettes, chewing tobacco, and e-cigarettes. If you need help quitting, ask your health care provider.  Do not use street drugs or medicines that have not been prescribed to you during your pregnancy.  Talk with your health care provider before taking any herbal supplements, even if you have been taking them regularly.  Make sure you gain a healthy amount of weight during your pregnancy.  Watch for infection. If you think that you might have an infection, get it checked right away.  Make sure to tell your health care provider if you have gone into preterm labor before. This information is not intended to replace advice given to you by your health care provider.  Make sure you discuss any questions you have with your health care provider. Document Released: 10/08/2003 Document Revised: 11/09/2018 Document Reviewed: 12/09/2015 Elsevier Patient Education  2020 [employer]

## 2019-08-05 ENCOUNTER — Encounter: Payer: Self-pay | Admitting: Advanced Practice Midwife

## 2019-08-13 ENCOUNTER — Encounter: Payer: Medicaid Other | Admitting: Obstetrics & Gynecology

## 2019-08-20 ENCOUNTER — Other Ambulatory Visit: Payer: Self-pay

## 2019-08-20 ENCOUNTER — Ambulatory Visit (INDEPENDENT_AMBULATORY_CARE_PROVIDER_SITE_OTHER): Payer: Medicaid Other | Admitting: Obstetrics & Gynecology

## 2019-08-20 ENCOUNTER — Encounter: Payer: Self-pay | Admitting: Obstetrics & Gynecology

## 2019-08-20 VITALS — BP 102/70 | HR 112 | Wt 210.0 lb

## 2019-08-20 DIAGNOSIS — Z23 Encounter for immunization: Secondary | ICD-10-CM | POA: Diagnosis not present

## 2019-08-20 DIAGNOSIS — Z3483 Encounter for supervision of other normal pregnancy, third trimester: Secondary | ICD-10-CM

## 2019-08-20 DIAGNOSIS — Z3A35 35 weeks gestation of pregnancy: Secondary | ICD-10-CM

## 2019-08-20 NOTE — Progress Notes (Signed)
   LOW-RISK PREGNANCY VISIT Patient name: Gabrielle Logan MRN [id number]  Date of birth: Nov 02, 1985 Chief Complaint:   Routine Prenatal Visit  History of Present Illness:   Gabrielle Logan is a 34 y.o. [id number] female at [redacted]w[redacted]d with an Estimated Date of Delivery: 09/21/19 being seen today for ongoing management of a low-risk pregnancy.  Today she reports no complaints. Contractions: Not present.  .  Movement: Present. denies leaking of fluid. Review of Systems:   Pertinent items are noted in HPI Denies abnormal vaginal discharge w/ itching/odor/irritation, headaches, visual changes, shortness of breath, chest pain, abdominal pain, severe nausea/vomiting, or problems with urination or bowel movements unless otherwise stated above. Pertinent History Reviewed:  Reviewed past medical,surgical, social, obstetrical and family history.  Reviewed problem list, medications and allergies. Physical Assessment:   Vitals:   08/20/19 1336  BP: 102/70  Pulse: (!) 112  Weight: 210 lb (95.3 kg)  Body mass index is 37.2 kg/m.        Physical Examination:   General appearance: Well appearing, and in no distress  Mental status: Alert, oriented to person, place, and time  Skin: Warm & dry  Cardiovascular: Normal heart rate noted  Respiratory: Normal respiratory effort, no distress  Abdomen: Soft, gravid, nontender  Pelvic: Cervical exam deferred         Extremities: Edema: None  Fetal Status: Fetal Heart Rate (bpm): 145 Fundal Height: 36 cm Movement: Present    Chaperone: n/a    No results found for this or any previous visit (from the past 24 hour(s)).  Assessment & Plan:  1) Low-risk pregnancy [id number] at [redacted]w[redacted]d with an Estimated Date of Delivery: 09/21/19   2) ,    Meds: No orders of the defined types were placed in this encounter.  Labs/procedures today:   Plan:  Continue routine obstetrical care  Next visit: prefers in person    Reviewed: Term labor symptoms and general obstetric  precautions including but not limited to vaginal bleeding, contractions, leaking of fluid and fetal movement were reviewed in detail with the patient.  All questions were answered. Has home bp cuff. Rx faxed to . Check bp weekly, let us know if >140/90.   Follow-up: Return in about 2 weeks (around 09/03/2019) for LROB.  Orders Placed This Encounter  Procedures  . Tdap vaccine greater than or equal to 7yo IM   Lazaro Arms  08/20/2019 2:16 PM

## 2019-09-03 ENCOUNTER — Encounter: Payer: Medicaid Other | Admitting: Advanced Practice Midwife

## 2019-09-06 ENCOUNTER — Encounter (HOSPITAL_COMMUNITY): Payer: Self-pay

## 2019-09-06 NOTE — Patient Instructions (Signed)
Emmie N Jenison  09/06/2019   Your procedure is scheduled on:  09/18/2019  Arrive at 0815 at Entrance C on [address] at [facility]  and [employer]. You are invited to use the FREE valet parking or use the Visitor's parking deck.  Pick up the phone at the desk and dial [phone].  Call this number if you have problems the morning of surgery: [phone]  Remember:   Do not eat food:(After Midnight) Desps de medianoche.  Do not drink clear liquids: (After Midnight) Desps de medianoche.  Take these medicines the morning of surgery with A SIP OF WATER:  none   Do not wear jewelry, make-up or nail polish.  Do not wear lotions, powders, or perfumes. Do not wear deodorant.  Do not shave 48 hours prior to surgery.  Do not bring valuables to the hospital.  [facility] is not   responsible for any belongings or valuables brought to the hospital.  Contacts, dentures or bridgework may not be worn into surgery.  Leave suitcase in the car. After surgery it may be brought to your room.  For patients admitted to the hospital, checkout time is 11:00 AM the day of              discharge.      Please read over the following fact sheets that you were given:     Preparing for Surgery

## 2019-09-09 ENCOUNTER — Encounter: Payer: Self-pay | Admitting: Advanced Practice Midwife

## 2019-09-09 ENCOUNTER — Other Ambulatory Visit: Payer: Self-pay

## 2019-09-09 ENCOUNTER — Ambulatory Visit (INDEPENDENT_AMBULATORY_CARE_PROVIDER_SITE_OTHER): Payer: Medicaid Other | Admitting: Advanced Practice Midwife

## 2019-09-09 ENCOUNTER — Encounter (HOSPITAL_COMMUNITY): Payer: Self-pay

## 2019-09-09 ENCOUNTER — Other Ambulatory Visit (HOSPITAL_COMMUNITY)
Admission: RE | Admit: 2019-09-09 | Discharge: 2019-09-09 | Disposition: A | Discharge status: Home / Self Care | Payer: Medicaid Other | Source: Ambulatory Visit | Attending: Advanced Practice Midwife | Admitting: Advanced Practice Midwife

## 2019-09-09 VITALS — BP 127/79 | HR 97 | Wt 215.2 lb

## 2019-09-09 DIAGNOSIS — Z3483 Encounter for supervision of other normal pregnancy, third trimester: Secondary | ICD-10-CM | POA: Diagnosis present

## 2019-09-09 DIAGNOSIS — Z3A38 38 weeks gestation of pregnancy: Secondary | ICD-10-CM

## 2019-09-09 NOTE — Patient Instructions (Addendum)
Felicity N Bruhl  09/09/2019   Your procedure is scheduled on:  09/17/2019  Arrive at 1000 at Entrance C on [employer] at [facility]  and [employer]. You are invited to use the FREE valet parking or use the Visitor's parking deck.  Pick up the phone at the desk and dial [phone].  Call this number if you have problems the morning of surgery: [phone]  Remember:   Do not eat food:(After Midnight) Desps de medianoche.  Do not drink clear liquids: (6 Hours before arrival) 6 horas ante llegada.  Take these medicines the morning of surgery with A SIP OF WATER:  none   Do not wear jewelry, make-up or nail polish.  Do not wear lotions, powders, or perfumes. Do not wear deodorant.  Do not shave 48 hours prior to surgery.  Do not bring valuables to the hospital.  [facility] is not   responsible for any belongings or valuables brought to the hospital.  Contacts, dentures or bridgework may not be worn into surgery.  Leave suitcase in the car. After surgery it may be brought to your room.  For patients admitted to the hospital, checkout time is 11:00 AM the day of              discharge.      Please read over the following fact sheets that you were given:     Preparing for Surgery

## 2019-09-09 NOTE — Progress Notes (Signed)
   LOW-RISK PREGNANCY VISIT Patient name: Gabrielle Logan MRN [id number]  Date of birth: Aug 25, 1985 Chief Complaint:   Routine Prenatal Visit (gbs-gc-chl)  History of Present Illness:   Gabrielle Logan is a 34 y.o. [phone] female at [redacted]w[redacted]d with an Estimated Date of Delivery: 09/21/19 being seen today for ongoing management of a low-risk pregnancy.  Today she reports no complaints. Contractions: Irregular.  .  Movement: Present. denies leaking of fluid. Review of Systems:   Pertinent items are noted in HPI Denies abnormal vaginal discharge w/ itching/odor/irritation, headaches, visual changes, shortness of breath, chest pain, abdominal pain, severe nausea/vomiting, or problems with urination or bowel movements unless otherwise stated above. Pertinent History Reviewed:  Reviewed past medical,surgical, social, obstetrical and family history.  Reviewed problem list, medications and allergies. Physical Assessment:   Vitals:   09/09/19 1428  BP: 127/79  Pulse: 97  Weight: 215 lb 3.2 oz (97.6 kg)  Body mass index is 38.12 kg/m.        Physical Examination:   General appearance: Well appearing, and in no distress  Mental status: Alert, oriented to person, place, and time  Skin: Warm & dry  Cardiovascular: Normal heart rate noted  Respiratory: Normal respiratory effort, no distress  Abdomen: Soft, gravid, nontender  Pelvic: Cervical exam performed  Dilation: Closed Effacement (%): Thick    Extremities: Edema: None  Fetal Status: Fetal Heart Rate (bpm): 130 Fundal Height: 38 cm Movement: Present    No results found for this or any previous visit (from the past 24 hour(s)).  Assessment & Plan:  1) Low-risk pregnancy [id number] at [redacted]w[redacted]d with an Estimated Date of Delivery: 09/21/19   2) Hx C/S x 3, for repeat 09/17/19 with Dr Emelda Fear  3) Decision for POPs for contraception, no BTL   Meds: No orders of the defined types were placed in this encounter.  Labs/procedures today:  GBS/GC/chlam  Plan:  Continue routine obstetrical care- to call us with concerns, otherwise will present for rLTCS on 09/17/19  Reviewed: Term labor symptoms and general obstetric precautions including but not limited to vaginal bleeding, contractions, leaking of fluid and fetal movement were reviewed in detail with the patient.  All questions were answered. Has home bp cuff. Check bp weekly, let us know if >140/90.   Follow-up: Return for 1-2wk incision check; 4-6wk PP visit.  Orders Placed This Encounter  Procedures  . Strep Gp B NAA   Arabella Merles [facility] 09/09/2019 3:01 PM

## 2019-09-09 NOTE — Patient Instructions (Signed)
Braxton Hicks Contractions °Contractions of the uterus can occur throughout pregnancy, but they are not always a sign that you are in labor. You may have practice contractions called Braxton Hicks contractions. These false labor contractions are sometimes confused with true labor. °What are Braxton Hicks contractions? °Braxton Hicks contractions are tightening movements that occur in the muscles of the uterus before labor. Unlike true labor contractions, these contractions do not result in opening (dilation) and thinning of the cervix. Toward the end of pregnancy (32-34 weeks), Braxton Hicks contractions can happen more often and may become stronger. These contractions are sometimes difficult to tell apart from true labor because they can be very uncomfortable. You should not feel embarrassed if you go to the hospital with false labor. °Sometimes, the only way to tell if you are in true labor is for your health care provider to look for changes in the cervix. The health care provider will do a physical exam and may monitor your contractions. If you are not in true labor, the exam should show that your cervix is not dilating and your water has not broken. °If there are no other health problems associated with your pregnancy, it is completely safe for you to be sent home with false labor. You may continue to have Braxton Hicks contractions until you go into true labor. °How to tell the difference between true labor and false labor °True labor °· Contractions last 30-70 seconds. °· Contractions become very regular. °· Discomfort is usually felt in the top of the uterus, and it spreads to the lower abdomen and low back. °· Contractions do not go away with walking. °· Contractions usually become more intense and increase in frequency. °· The cervix dilates and gets thinner. °False labor °· Contractions are usually shorter and not as strong as true labor contractions. °· Contractions are usually irregular. °· Contractions  are often felt in the front of the lower abdomen and in the groin. °· Contractions may go away when you walk around or change positions while lying down. °· Contractions get weaker and are shorter-lasting as time goes on. °· The cervix usually does not dilate or become thin. °Follow these instructions at home: ° °· Take over-the-counter and prescription medicines only as told by your health care provider. °· Keep up with your usual exercises and follow other instructions from your health care provider. °· Eat and drink lightly if you think you are going into labor. °· If Braxton Hicks contractions are making you uncomfortable: °? Change your position from lying down or resting to walking, or change from walking to resting. °? Sit and rest in a tub of warm water. °? Drink enough fluid to keep your urine pale yellow. Dehydration may cause these contractions. °? Do slow and deep breathing several times an hour. °· Keep all follow-up prenatal visits as told by your health care provider. This is important. °Contact a health care provider if: °· You have a fever. °· You have continuous pain in your abdomen. °Get help right away if: °· Your contractions become stronger, more regular, and closer together. °· You have fluid leaking or gushing from your vagina. °· You pass blood-tinged mucus (bloody show). °· You have bleeding from your vagina. °· You have low back pain that you never had before. °· You feel your baby’s head pushing down and causing pelvic pressure. °· Your baby is not moving inside you as much as it used to. °Summary °· Contractions that occur before labor are   called Braxton Hicks contractions, false labor, or practice contractions. °· Braxton Hicks contractions are usually shorter, weaker, farther apart, and less regular than true labor contractions. True labor contractions usually become progressively stronger and regular, and they become more frequent. °· Manage discomfort from Braxton Hicks contractions  by changing position, resting in a warm bath, drinking plenty of water, or practicing deep breathing. °This information is not intended to replace advice given to you by your health care provider. Make sure you discuss any questions you have with your health care provider. °Document Revised: 06/30/2017 Document Reviewed: 12/01/2016 °Elsevier Patient Education © 2020 [employer]. ° °

## 2019-09-10 ENCOUNTER — Encounter (HOSPITAL_COMMUNITY): Payer: Self-pay

## 2019-09-11 ENCOUNTER — Encounter: Payer: Self-pay | Admitting: Advanced Practice Midwife

## 2019-09-11 DIAGNOSIS — B951 Streptococcus, group B, as the cause of diseases classified elsewhere: Secondary | ICD-10-CM | POA: Insufficient documentation

## 2019-09-11 LAB — STREP GP B NAA: Strep Gp B NAA: POSITIVE — AB

## 2019-09-11 LAB — CERVICOVAGINAL ANCILLARY ONLY
Chlamydia: NEGATIVE
Comment: NEGATIVE
Comment: NORMAL
Neisseria Gonorrhea: NEGATIVE

## 2019-09-12 ENCOUNTER — Telehealth (HOSPITAL_COMMUNITY): Payer: Self-pay | Admitting: *Deleted

## 2019-09-12 ENCOUNTER — Encounter (HOSPITAL_COMMUNITY): Payer: Self-pay

## 2019-09-12 NOTE — Telephone Encounter (Signed)
Left message updating of new arrival time for CS on 2/16

## 2019-09-15 ENCOUNTER — Other Ambulatory Visit: Payer: Self-pay

## 2019-09-15 ENCOUNTER — Other Ambulatory Visit (HOSPITAL_COMMUNITY)
Admission: RE | Admit: 2019-09-15 | Discharge: 2019-09-15 | Disposition: A | Discharge status: Home / Self Care | Payer: Medicaid Other | Source: Ambulatory Visit | Attending: Obstetrics and Gynecology | Admitting: Obstetrics and Gynecology

## 2019-09-15 DIAGNOSIS — Z01812 Encounter for preprocedural laboratory examination: Secondary | ICD-10-CM | POA: Insufficient documentation

## 2019-09-15 DIAGNOSIS — Z20822 Contact with and (suspected) exposure to covid-19: Secondary | ICD-10-CM | POA: Insufficient documentation

## 2019-09-15 DIAGNOSIS — O34219 Maternal care for unspecified type scar from previous cesarean delivery: Secondary | ICD-10-CM | POA: Diagnosis not present

## 2019-09-15 LAB — COMPREHENSIVE METABOLIC PANEL
ALT: 17 U/L (ref 0–44)
AST: 22 U/L (ref 15–41)
Albumin: 2.8 g/dL — ABNORMAL LOW (ref 3.5–5.0)
Alkaline Phosphatase: 148 U/L — ABNORMAL HIGH (ref 38–126)
Anion gap: 11 (ref 5–15)
BUN: 10 mg/dL (ref 6–20)
CO2: 20 mmol/L — ABNORMAL LOW (ref 22–32)
Calcium: 9 mg/dL (ref 8.9–10.3)
Chloride: 106 mmol/L (ref 98–111)
Creatinine, Ser: 0.87 mg/dL (ref 0.44–1.00)
GFR calc Af Amer: >60 mL/min (ref >60)
GFR calc non Af Amer: >60 mL/min (ref >60)
Glucose, Bld: 98 mg/dL (ref 70–99)
Potassium: 3.6 mmol/L (ref 3.5–5.1)
Sodium: 137 mmol/L (ref 135–145)
Total Bilirubin: 0.4 mg/dL (ref 0.3–1.2)
Total Protein: 6.6 g/dL (ref 6.5–8.1)

## 2019-09-15 LAB — ABO/RH: ABO/RH(D): A POS

## 2019-09-15 LAB — CBC
HCT: 40.1 % (ref 36.0–46.0)
Hemoglobin: 13.4 g/dL (ref 12.0–15.0)
MCH: 28.9 pg (ref 26.0–34.0)
MCHC: 33.4 g/dL (ref 30.0–36.0)
MCV: 86.4 fL (ref 80.0–100.0)
Platelets: 284 10*3/uL (ref 150–400)
RBC: 4.64 MIL/uL (ref 3.87–5.11)
RDW: 13.2 % (ref 11.5–15.5)
WBC: 15.4 10*3/uL — ABNORMAL HIGH (ref 4.0–10.5)
nRBC: 0 % (ref 0.0–0.2)

## 2019-09-15 LAB — TYPE AND SCREEN
ABO/RH(D): A POS
Antibody Screen: NEGATIVE

## 2019-09-15 LAB — SARS CORONAVIRUS 2 (TAT 6-24 HRS): SARS Coronavirus 2: NEGATIVE

## 2019-09-15 LAB — RPR: RPR Ser Ql: NONREACTIVE

## 2019-09-15 NOTE — MAU Note (Signed)
Pt here for PAT covid and lab draw. Denies symptoms. Swab collected.

## 2019-09-16 ENCOUNTER — Other Ambulatory Visit (HOSPITAL_COMMUNITY)
Admission: RE | Admit: 2019-09-16 | Discharge: 2019-09-16 | Disposition: A | Discharge status: Home / Self Care | Payer: Medicaid Other | Source: Ambulatory Visit

## 2019-09-16 NOTE — Anesthesia Preprocedure Evaluation (Addendum)
Anesthesia Evaluation  Patient identified by MRN, date of birth, ID band Patient awake    Reviewed: Allergy & Precautions, NPO status , Patient's Chart, lab work & pertinent test results  Airway Mallampati: II  TM Distance: >3 FB Neck ROM: Full    Dental no notable dental hx. (+) Chipped   Pulmonary asthma ,    Pulmonary exam normal breath sounds clear to auscultation       Cardiovascular Exercise Tolerance: Good negative cardio ROS Normal cardiovascular exam Rhythm:Regular Rate:Normal     Neuro/Psych PSYCHIATRIC DISORDERS Anxiety Depression OCD, PTSD, ADHDnegative neurological ROS     GI/Hepatic Neg liver ROS, GERD  Medicated,  Endo/Other  Morbid obesity  Renal/GU negative Renal ROS  negative genitourinary   Musculoskeletal negative musculoskeletal ROS (+)   Abdominal   Peds negative pediatric ROS (+)  Hematology negative hematology ROS (+) Plt 284k   Anesthesia Other Findings   Reproductive/Obstetrics (+) Pregnancy 3 prior c sections                            Anesthesia Physical Anesthesia Plan  ASA: III  Anesthesia Plan: Spinal   Post-op Pain Management:    Induction:   PONV Risk Score and Plan: 2 and Ondansetron and Treatment may vary due to age or medical condition  Airway Management Planned: Natural Airway  Additional Equipment: None  Intra-op Plan:   Post-operative Plan:   Informed Consent: I have reviewed the patients History and Physical, chart, labs and discussed the procedure including the risks, benefits and alternatives for the proposed anesthesia with the patient or authorized representative who has indicated his/her understanding and acceptance.       Plan Discussed with: CRNA  Anesthesia Plan Comments:         Anesthesia Quick Evaluation

## 2019-09-17 ENCOUNTER — Inpatient Hospital Stay (HOSPITAL_COMMUNITY): Payer: Medicaid Other | Admitting: Anesthesiology

## 2019-09-17 ENCOUNTER — Inpatient Hospital Stay (HOSPITAL_COMMUNITY)
Admission: RE | Admit: 2019-09-17 | Discharge: 2019-09-20 | DRG: 788 | Disposition: A | Discharge status: Home / Self Care | Payer: Medicaid Other | Attending: Obstetrics and Gynecology | Admitting: Obstetrics and Gynecology

## 2019-09-17 ENCOUNTER — Encounter (HOSPITAL_COMMUNITY): Payer: Self-pay | Admitting: Obstetrics and Gynecology

## 2019-09-17 ENCOUNTER — Other Ambulatory Visit: Payer: Self-pay | Admitting: Pediatrics

## 2019-09-17 ENCOUNTER — Encounter (HOSPITAL_COMMUNITY)
Admission: RE | Disposition: A | Discharge status: Home / Self Care | Payer: Self-pay | Source: Home / Self Care | Attending: Obstetrics and Gynecology

## 2019-09-17 ENCOUNTER — Other Ambulatory Visit: Payer: Self-pay

## 2019-09-17 DIAGNOSIS — O34211 Maternal care for low transverse scar from previous cesarean delivery: Principal | ICD-10-CM | POA: Diagnosis present

## 2019-09-17 DIAGNOSIS — O99344 Other mental disorders complicating childbirth: Secondary | ICD-10-CM | POA: Diagnosis present

## 2019-09-17 DIAGNOSIS — O99214 Obesity complicating childbirth: Secondary | ICD-10-CM | POA: Diagnosis present

## 2019-09-17 DIAGNOSIS — Z20822 Contact with and (suspected) exposure to covid-19: Secondary | ICD-10-CM | POA: Diagnosis present

## 2019-09-17 DIAGNOSIS — F329 Major depressive disorder, single episode, unspecified: Secondary | ICD-10-CM | POA: Diagnosis present

## 2019-09-17 DIAGNOSIS — Z3A39 39 weeks gestation of pregnancy: Secondary | ICD-10-CM

## 2019-09-17 DIAGNOSIS — F4310 Post-traumatic stress disorder, unspecified: Secondary | ICD-10-CM | POA: Diagnosis present

## 2019-09-17 DIAGNOSIS — O99824 Streptococcus B carrier state complicating childbirth: Secondary | ICD-10-CM | POA: Diagnosis present

## 2019-09-17 DIAGNOSIS — B951 Streptococcus, group B, as the cause of diseases classified elsewhere: Secondary | ICD-10-CM | POA: Diagnosis present

## 2019-09-17 DIAGNOSIS — Z98891 History of uterine scar from previous surgery: Secondary | ICD-10-CM

## 2019-09-17 LAB — CBC
HCT: 35.2 % — ABNORMAL LOW (ref 36.0–46.0)
Hemoglobin: 11.8 g/dL — ABNORMAL LOW (ref 12.0–15.0)
MCH: 29 pg (ref 26.0–34.0)
MCHC: 33.5 g/dL (ref 30.0–36.0)
MCV: 86.5 fL (ref 80.0–100.0)
Platelets: 287 10*3/uL (ref 150–400)
RBC: 4.07 MIL/uL (ref 3.87–5.11)
RDW: 13.2 % (ref 11.5–15.5)
WBC: 22.6 10*3/uL — ABNORMAL HIGH (ref 4.0–10.5)
nRBC: 0 % (ref 0.0–0.2)

## 2019-09-17 LAB — CREATININE, SERUM
Creatinine, Ser: 0.84 mg/dL (ref 0.44–1.00)
GFR calc Af Amer: >60 mL/min (ref >60)
GFR calc non Af Amer: >60 mL/min (ref >60)

## 2019-09-17 SURGERY — Surgical Case
Anesthesia: Spinal | Site: Abdomen | Wound class: Clean Contaminated

## 2019-09-17 MED ORDER — METOCLOPRAMIDE HCL 5 MG/ML IJ SOLN
INTRAMUSCULAR | Status: AC
  Filled 2019-09-17: qty 2

## 2019-09-17 MED ORDER — SOD CITRATE-CITRIC ACID 500-334 MG/5ML PO SOLN
30.0000 mL | ORAL | Status: AC
  Administered 2019-09-17: 30 mL via ORAL

## 2019-09-17 MED ORDER — SODIUM CHLORIDE 0.9 % IV SOLN: INTRAVENOUS | Status: DC | PRN

## 2019-09-17 MED ORDER — MORPHINE SULFATE (PF) 0.5 MG/ML IJ SOLN
INTRAMUSCULAR | Status: AC
  Filled 2019-09-17: qty 10

## 2019-09-17 MED ORDER — PRENATAL MULTIVITAMIN CH
1.0000 | ORAL_TABLET | Freq: Every day | ORAL | Status: DC
  Filled 2019-09-17 (×2): qty 1

## 2019-09-17 MED ORDER — FAMOTIDINE 20 MG PO TABS: 20.0000 mg | ORAL_TABLET | Freq: Once | ORAL | Status: DC

## 2019-09-17 MED ORDER — MEPERIDINE HCL 25 MG/ML IJ SOLN
INTRAMUSCULAR | Status: AC
  Filled 2019-09-17: qty 1

## 2019-09-17 MED ORDER — PHENYLEPHRINE HCL-NACL 20-0.9 MG/250ML-% IV SOLN
INTRAVENOUS | Status: AC
  Filled 2019-09-17: qty 250

## 2019-09-17 MED ORDER — CEFAZOLIN SODIUM-DEXTROSE 2-4 GM/100ML-% IV SOLN
2.0000 g | INTRAVENOUS | Status: AC
  Administered 2019-09-17: 2 g via INTRAVENOUS

## 2019-09-17 MED ORDER — ACETAMINOPHEN 325 MG PO TABS
650.0000 mg | ORAL_TABLET | Freq: Four times a day (QID) | ORAL | Status: DC | PRN
  Administered 2019-09-19 – 2019-09-20 (×2): 650 mg via ORAL
  Filled 2019-09-17 (×2): qty 2

## 2019-09-17 MED ORDER — COCONUT OIL OIL: 1.0000 "application " | TOPICAL_OIL | Status: DC | PRN

## 2019-09-17 MED ORDER — SODIUM CHLORIDE 0.9% FLUSH: 3.0000 mL | INTRAVENOUS | Status: DC | PRN

## 2019-09-17 MED ORDER — LACTATED RINGERS IV SOLN: INTRAVENOUS | Status: DC

## 2019-09-17 MED ORDER — NALBUPHINE HCL 10 MG/ML IJ SOLN: 5.0000 mg | INTRAMUSCULAR | Status: DC | PRN

## 2019-09-17 MED ORDER — OXYCODONE HCL 5 MG PO TABS
5.0000 mg | ORAL_TABLET | ORAL | Status: DC | PRN
  Administered 2019-09-18: 17:00:00 10 mg via ORAL
  Administered 2019-09-18: 12:00:00 5 mg via ORAL
  Administered 2019-09-19 (×2): 10 mg via ORAL
  Administered 2019-09-19: 03:00:00 5 mg via ORAL
  Administered 2019-09-19: 10 mg via ORAL
  Administered 2019-09-19 – 2019-09-20 (×2): 5 mg via ORAL
  Administered 2019-09-20: 10 mg via ORAL
  Filled 2019-09-17: qty 2
  Filled 2019-09-17: qty 1
  Filled 2019-09-17: qty 2
  Filled 2019-09-17: qty 1
  Filled 2019-09-17: qty 2
  Filled 2019-09-17 (×2): qty 1
  Filled 2019-09-17 (×2): qty 2

## 2019-09-17 MED ORDER — ONDANSETRON HCL 4 MG/2ML IJ SOLN
4.0000 mg | Freq: Three times a day (TID) | INTRAMUSCULAR | Status: DC | PRN
  Administered 2019-09-17: 4 mg via INTRAVENOUS

## 2019-09-17 MED ORDER — DIBUCAINE (PERIANAL) 1 % EX OINT: 1.0000 "application " | TOPICAL_OINTMENT | CUTANEOUS | Status: DC | PRN

## 2019-09-17 MED ORDER — SENNOSIDES-DOCUSATE SODIUM 8.6-50 MG PO TABS
2.0000 | ORAL_TABLET | ORAL | Status: DC
  Administered 2019-09-17 – 2019-09-19 (×3): 2 via ORAL
  Filled 2019-09-17 (×3): qty 2

## 2019-09-17 MED ORDER — PHENYLEPHRINE HCL-NACL 20-0.9 MG/250ML-% IV SOLN
INTRAVENOUS | Status: DC | PRN
  Administered 2019-09-17: 60 ug/min via INTRAVENOUS

## 2019-09-17 MED ORDER — DIPHENHYDRAMINE HCL 50 MG/ML IJ SOLN: 12.5000 mg | INTRAMUSCULAR | Status: DC | PRN

## 2019-09-17 MED ORDER — METOCLOPRAMIDE HCL 5 MG/ML IJ SOLN
INTRAMUSCULAR | Status: DC | PRN
  Administered 2019-09-17: 10 mg via INTRAVENOUS

## 2019-09-17 MED ORDER — SODIUM CHLORIDE 0.9 % IR SOLN
Status: DC | PRN
  Administered 2019-09-17: 1000 mL

## 2019-09-17 MED ORDER — FENTANYL CITRATE (PF) 100 MCG/2ML IJ SOLN: 25.0000 ug | INTRAMUSCULAR | Status: DC | PRN

## 2019-09-17 MED ORDER — SIMETHICONE 80 MG PO CHEW: 80.0000 mg | CHEWABLE_TABLET | ORAL | Status: DC | PRN

## 2019-09-17 MED ORDER — FENTANYL CITRATE (PF) 100 MCG/2ML IJ SOLN
INTRAMUSCULAR | Status: DC | PRN
  Administered 2019-09-17: 15 ug via INTRATHECAL

## 2019-09-17 MED ORDER — CEFAZOLIN SODIUM-DEXTROSE 2-4 GM/100ML-% IV SOLN
INTRAVENOUS | Status: AC
  Filled 2019-09-17: qty 100

## 2019-09-17 MED ORDER — NALOXONE HCL 0.4 MG/ML IJ SOLN: 0.4000 mg | INTRAMUSCULAR | Status: DC | PRN

## 2019-09-17 MED ORDER — ONDANSETRON HCL 4 MG/2ML IJ SOLN
INTRAMUSCULAR | Status: AC
  Filled 2019-09-17: qty 2

## 2019-09-17 MED ORDER — MORPHINE SULFATE (PF) 0.5 MG/ML IJ SOLN
INTRAMUSCULAR | Status: DC | PRN
  Administered 2019-09-17: .15 mg via INTRATHECAL

## 2019-09-17 MED ORDER — WITCH HAZEL-GLYCERIN EX PADS: 1.0000 "application " | MEDICATED_PAD | CUTANEOUS | Status: DC | PRN

## 2019-09-17 MED ORDER — KETOROLAC TROMETHAMINE 30 MG/ML IJ SOLN
INTRAMUSCULAR | Status: AC
  Filled 2019-09-17: qty 1

## 2019-09-17 MED ORDER — LACTATED RINGERS IV SOLN: INTRAVENOUS | Status: DC | PRN

## 2019-09-17 MED ORDER — NALBUPHINE HCL 10 MG/ML IJ SOLN: 5.0000 mg | Freq: Once | INTRAMUSCULAR | Status: DC | PRN

## 2019-09-17 MED ORDER — DEXAMETHASONE SODIUM PHOSPHATE 10 MG/ML IJ SOLN
INTRAMUSCULAR | Status: DC | PRN
  Administered 2019-09-17: 10 mg via INTRAVENOUS

## 2019-09-17 MED ORDER — SCOPOLAMINE 1 MG/3DAYS TD PT72
1.0000 | MEDICATED_PATCH | Freq: Once | TRANSDERMAL | Status: AC
  Administered 2019-09-17: 1.5 mg via TRANSDERMAL

## 2019-09-17 MED ORDER — MEPERIDINE HCL 25 MG/ML IJ SOLN
6.2500 mg | INTRAMUSCULAR | Status: DC | PRN
  Administered 2019-09-17: 12.5 mg via INTRAVENOUS

## 2019-09-17 MED ORDER — DEXAMETHASONE SODIUM PHOSPHATE 10 MG/ML IJ SOLN
INTRAMUSCULAR | Status: AC
  Filled 2019-09-17: qty 1

## 2019-09-17 MED ORDER — ZOLPIDEM TARTRATE 5 MG PO TABS: 5.0000 mg | ORAL_TABLET | Freq: Every evening | ORAL | Status: DC | PRN

## 2019-09-17 MED ORDER — SCOPOLAMINE 1 MG/3DAYS TD PT72
MEDICATED_PATCH | TRANSDERMAL | Status: AC
  Filled 2019-09-17: qty 1

## 2019-09-17 MED ORDER — ENOXAPARIN SODIUM 60 MG/0.6ML ~~LOC~~ SOLN
50.0000 mg | SUBCUTANEOUS | Status: DC
  Filled 2019-09-17: qty 0.6

## 2019-09-17 MED ORDER — SIMETHICONE 80 MG PO CHEW
80.0000 mg | CHEWABLE_TABLET | Freq: Three times a day (TID) | ORAL | Status: DC
  Administered 2019-09-17 – 2019-09-20 (×8): 80 mg via ORAL
  Filled 2019-09-17 (×9): qty 1

## 2019-09-17 MED ORDER — FENTANYL CITRATE (PF) 100 MCG/2ML IJ SOLN
INTRAMUSCULAR | Status: AC
  Filled 2019-09-17: qty 2

## 2019-09-17 MED ORDER — TETANUS-DIPHTH-ACELL PERTUSSIS 5-2.5-18.5 LF-MCG/0.5 IM SUSP: 0.5000 mL | Freq: Once | INTRAMUSCULAR | Status: DC

## 2019-09-17 MED ORDER — KETOROLAC TROMETHAMINE 30 MG/ML IJ SOLN
30.0000 mg | Freq: Four times a day (QID) | INTRAMUSCULAR | Status: AC | PRN
  Administered 2019-09-17: 30 mg via INTRAMUSCULAR

## 2019-09-17 MED ORDER — DIPHENHYDRAMINE HCL 25 MG PO CAPS: 25.0000 mg | ORAL_CAPSULE | ORAL | Status: DC | PRN

## 2019-09-17 MED ORDER — MENTHOL 3 MG MT LOZG: 1.0000 | LOZENGE | OROMUCOSAL | Status: DC | PRN

## 2019-09-17 MED ORDER — STERILE WATER FOR IRRIGATION IR SOLN
Status: DC | PRN
  Administered 2019-09-17: 1000 mL

## 2019-09-17 MED ORDER — SERTRALINE HCL 25 MG PO TABS: 25.0000 mg | ORAL_TABLET | Freq: Every day | ORAL | Status: DC

## 2019-09-17 MED ORDER — OXYTOCIN 40 UNITS IN NORMAL SALINE INFUSION - SIMPLE MED: INTRAVENOUS | Status: DC | PRN

## 2019-09-17 MED ORDER — SCOPOLAMINE 1 MG/3DAYS TD PT72: 1.0000 | MEDICATED_PATCH | Freq: Once | TRANSDERMAL | Status: DC

## 2019-09-17 MED ORDER — KETOROLAC TROMETHAMINE 30 MG/ML IJ SOLN: 30.0000 mg | Freq: Four times a day (QID) | INTRAMUSCULAR | Status: AC | PRN

## 2019-09-17 MED ORDER — NALOXONE HCL 4 MG/10ML IJ SOLN
1.0000 ug/kg/h | INTRAVENOUS | Status: DC | PRN
  Filled 2019-09-17: qty 5

## 2019-09-17 MED ORDER — DIPHENHYDRAMINE HCL 25 MG PO CAPS
25.0000 mg | ORAL_CAPSULE | Freq: Four times a day (QID) | ORAL | Status: DC | PRN
  Administered 2019-09-17: 23:00:00 25 mg via ORAL
  Filled 2019-09-17: qty 1

## 2019-09-17 MED ORDER — OXYTOCIN 40 UNITS IN NORMAL SALINE INFUSION - SIMPLE MED: 2.5000 [IU]/h | INTRAVENOUS | Status: AC

## 2019-09-17 MED ORDER — OXYTOCIN 40 UNITS IN NORMAL SALINE INFUSION - SIMPLE MED
INTRAVENOUS | Status: AC
  Filled 2019-09-17: qty 1000

## 2019-09-17 MED ORDER — IBUPROFEN 800 MG PO TABS
800.0000 mg | ORAL_TABLET | Freq: Four times a day (QID) | ORAL | Status: DC
  Administered 2019-09-18 – 2019-09-20 (×8): 800 mg via ORAL
  Filled 2019-09-17 (×8): qty 1

## 2019-09-17 MED ORDER — OXYTOCIN 40 UNITS IN NORMAL SALINE INFUSION - SIMPLE MED
INTRAVENOUS | Status: DC | PRN
  Administered 2019-09-17: 1000 mL via INTRAVENOUS

## 2019-09-17 MED ORDER — BUPIVACAINE IN DEXTROSE 0.75-8.25 % IT SOLN
INTRATHECAL | Status: DC | PRN
  Administered 2019-09-17: 1.6 mL via INTRATHECAL

## 2019-09-17 MED ORDER — SIMETHICONE 80 MG PO CHEW
80.0000 mg | CHEWABLE_TABLET | ORAL | Status: DC
  Administered 2019-09-17 – 2019-09-19 (×3): 80 mg via ORAL
  Filled 2019-09-17 (×3): qty 1

## 2019-09-17 MED ORDER — SOD CITRATE-CITRIC ACID 500-334 MG/5ML PO SOLN
ORAL | Status: AC
  Filled 2019-09-17: qty 30

## 2019-09-17 MED ORDER — ACETAMINOPHEN 500 MG PO TABS
1000.0000 mg | ORAL_TABLET | Freq: Four times a day (QID) | ORAL | Status: AC
  Administered 2019-09-17 – 2019-09-18 (×3): 1000 mg via ORAL
  Filled 2019-09-17 (×3): qty 2

## 2019-09-17 MED ORDER — KETOROLAC TROMETHAMINE 30 MG/ML IJ SOLN
30.0000 mg | Freq: Four times a day (QID) | INTRAMUSCULAR | Status: AC
  Administered 2019-09-17 – 2019-09-18 (×3): 30 mg via INTRAVENOUS
  Filled 2019-09-17 (×3): qty 1

## 2019-09-17 MED ORDER — PROMETHAZINE HCL 25 MG/ML IJ SOLN: 6.2500 mg | INTRAMUSCULAR | Status: DC | PRN

## 2019-09-17 SURGICAL SUPPLY — 36 items
BENZOIN TINCTURE PRP APPL 2/3 (GAUZE/BANDAGES/DRESSINGS) ×3 IMPLANT
CHLORAPREP W/TINT 26ML (MISCELLANEOUS) ×3 IMPLANT
CLAMP CORD UMBIL (MISCELLANEOUS) ×3 IMPLANT
CLOSURE WOUND 1/2 X4 (GAUZE/BANDAGES/DRESSINGS) ×1
CLOTH BEACON ORANGE TIMEOUT ST (SAFETY) ×3 IMPLANT
DRSG OPSITE POSTOP 4X10 (GAUZE/BANDAGES/DRESSINGS) ×3 IMPLANT
ELECT REM PT RETURN 9FT ADLT (ELECTROSURGICAL) ×3
ELECTRODE REM PT RTRN 9FT ADLT (ELECTROSURGICAL) ×1 IMPLANT
GAUZE SPONGE 4X4 12PLY STRL LF (GAUZE/BANDAGES/DRESSINGS) ×3 IMPLANT
GLOVE BIOGEL PI IND STRL 7.0 (GLOVE) ×1 IMPLANT
GLOVE BIOGEL PI IND STRL 9 (GLOVE) ×1 IMPLANT
GLOVE BIOGEL PI INDICATOR 7.0 (GLOVE) ×2
GLOVE BIOGEL PI INDICATOR 9 (GLOVE) ×2
GLOVE SS PI 9.0 STRL (GLOVE) ×3 IMPLANT
GOWN STRL REUS W/TWL 2XL LVL3 (GOWN DISPOSABLE) ×3 IMPLANT
GOWN STRL REUS W/TWL LRG LVL3 (GOWN DISPOSABLE) ×3 IMPLANT
NS IRRIG 1000ML POUR BTL (IV SOLUTION) ×3 IMPLANT
PACK C SECTION WH (CUSTOM PROCEDURE TRAY) ×3 IMPLANT
PAD ABD 8X7 1/2 STERILE (GAUZE/BANDAGES/DRESSINGS) ×3 IMPLANT
PAD OB MATERNITY 4.3X12.25 (PERSONAL CARE ITEMS) ×3 IMPLANT
PENCIL SMOKE EVAC W/HOLSTER (ELECTROSURGICAL) ×3 IMPLANT
RTRCTR C-SECT PINK 25CM LRG (MISCELLANEOUS) ×3 IMPLANT
STRIP CLOSURE SKIN 1/2X4 (GAUZE/BANDAGES/DRESSINGS) ×2 IMPLANT
SUT MNCRL 0 VIOLET CTX 36 (SUTURE) ×2 IMPLANT
SUT MONOCRYL 0 CTX 36 (SUTURE) ×4
SUT PLAIN 2 0 (SUTURE) ×4
SUT PLAIN ABS 2-0 CT1 27XMFL (SUTURE) ×2 IMPLANT
SUT VIC AB 0 CT1 27 (SUTURE) ×2
SUT VIC AB 0 CT1 27XBRD ANBCTR (SUTURE) ×1 IMPLANT
SUT VIC AB 2-0 CT1 27 (SUTURE) ×2
SUT VIC AB 2-0 CT1 TAPERPNT 27 (SUTURE) ×1 IMPLANT
SUT VIC AB 4-0 KS 27 (SUTURE) ×3 IMPLANT
TAPE CLOTH SURG 4X10 WHT LF (GAUZE/BANDAGES/DRESSINGS) ×3 IMPLANT
TOWEL OR 17X24 6PK STRL BLUE (TOWEL DISPOSABLE) ×3 IMPLANT
TRAY FOLEY W/BAG SLVR 14FR LF (SET/KITS/TRAYS/PACK) ×3 IMPLANT
WATER STERILE IRR 1000ML POUR (IV SOLUTION) ×3 IMPLANT

## 2019-09-17 NOTE — Op Note (Signed)
[name] PROCEDURE DATE: 09/17/2019  PREOPERATIVE DIAGNOSES: Intrauterine pregnancy at [redacted]w[redacted]d weeks gestation; 3 prior c-sections  POSTOPERATIVE DIAGNOSES: The same; wide excision of old scar  PROCEDURE: Repeat Low Transverse Cesarean Section  SURGEON:  Dr. Mallory Shirk - Primary Dr. Barrington Ellison - Fellow  ASSISTANT:  An experienced assistant was required given the standard of surgical care given the complexity of the case.  This assistant was needed for exposure, dissection, suctioning, retraction, instrument exchange, assisting with delivery with administration of fundal pressure, and for overall help during the procedure.  ANESTHESIOLOGY TEAM: Anesthesiologist: Catalina Gravel, MD CRNA: Alvy Bimler, CRNA  INDICATIONS: Gabrielle Logan is a 34 y.o. [phone] at [redacted]w[redacted]d here for cesarean section secondary to the indications listed under preoperative diagnoses; please see preoperative note for further details.  The risks of cesarean section were discussed with the patient including but were not limited to: bleeding which may require transfusion or reoperation; infection which may require antibiotics; injury to bowel, bladder, ureters or other surrounding organs; injury to the fetus; need for additional procedures including hysterectomy in the event of a life-threatening hemorrhage; placental abnormalities wth subsequent pregnancies, incisional problems, thromboembolic phenomenon and other postoperative/anesthesia complications.   The patient concurred with the proposed plan, giving informed written consent for the procedure.    FINDINGS:  Viable female infant in cephalic presentation. Clear amniotic fluid.  Intact placenta, three vessel cord.  Normal uterus, fallopian tubes and ovaries bilaterally. Overall paucity of scar tissue. Few peritoneal and omental adhesions noted. APGAR (1 MIN): 9   APGAR (5 MINS): 9   APGAR (10 MINS):    ANESTHESIA: Spinal INTRAVENOUS FLUIDS: 2400  ml   ESTIMATED BLOOD LOSS: 212 ml URINE OUTPUT:  100 ml SPECIMENS: Placenta sent to L&D COMPLICATIONS: None immediate  PROCEDURE IN DETAIL:  The patient preoperatively received intravenous antibiotics and had sequential compression devices applied to her lower extremities.  She was then taken to the operating room where spinal anesthesia was administered and was found to be adequate. She was then placed in a dorsal supine position with a leftward tilt, and prepped and draped in a sterile manner.  A foley catheter was placed into her bladder and attached to constant gravity.  After an adequate timeout was performed, a Pfannenstiel skin incision was made with scalpel with wide excision and fat removal on her preexisting scar and carried through to the underlying layer of fascia. The fascia was incised in the midline, and this incision was extended bilaterally using blunt traction.  Kocher clamps were applied to the superior aspect of the fascial incision and the underlying rectus muscles were dissected off bluntly and sharply.  A similar process was carried out on the inferior aspect of the fascial incision. The rectus muscles were separated in the midline and the peritoneum was entered bluntly. Few omental and peritoneal adhesions noted and lysed with Mayo scissors and Bovie. The Alexis self-retaining retractor was introduced into the abdominal cavity.  Attention was turned to the lower uterine segment where a low transverse hysterotomy was made with a scalpel and extended bilaterally bluntly.  The infant was successfully delivered, the cord was clamped and cut after one minute, and the infant was handed over to the awaiting neonatology team. Uterine massage was then administered, and the placenta delivered intact with a three-vessel cord. The uterus was then cleared of clots and debris.  The hysterotomy was closed with 0 Monocryl in a running locked fashion, and an imbricating layer was also  placed with 0  Monocryl. The pelvis was cleared of all clot and debris. Hemostasis was confirmed on all surfaces.  The retractor was removed.  The peritoneum was closed with a 2-0 Vicryl running stitch. The fascia was then closed using 0 Vicryl in a running fashion.  The subcutaneous layer was irrigated, reapproximated with 2-0 plain gut interrupted stitches, and the skin was closed with a 4-0 Vicryl subcuticular stitch. The patient tolerated the procedure well. Sponge, instrument and needle counts were correct x 3.  She was taken to the recovery room in stable condition.   Jerilynn Birkenhead, MD [facility] Family Medicine Fellow, [facility] for [employer], [facility]

## 2019-09-17 NOTE — Anesthesia Postprocedure Evaluation (Signed)
Anesthesia Post Note  Patient: [name]  Procedure(s) Performed: CESAREAN SECTION (N/A Abdomen)     Patient location during evaluation: PACU Anesthesia Type: Spinal Level of consciousness: awake and alert and oriented Pain management: pain level controlled Vital Signs Assessment: post-procedure vital signs reviewed and stable Respiratory status: spontaneous breathing, nonlabored ventilation and respiratory function stable Cardiovascular status: blood pressure returned to baseline and stable Postop Assessment: no headache, no backache, spinal receding and patient able to bend at knees Anesthetic complications: no    Last Vitals:  Vitals:   09/17/19 1300 09/17/19 1315  BP: 106/69 116/66  Pulse: 100 (!) 103  Resp: 15 20  Temp:    SpO2: 99% 98%    Last Pain:  Vitals:   09/17/19 1315  TempSrc:   PainSc: 0-No pain    LLE Motor Response: Purposeful movement (09/17/19 1315) LLE Sensation: Numbness;Tingling (09/17/19 1315) RLE Motor Response: Purposeful movement (09/17/19 1315) RLE Sensation: Numbness;Tingling (09/17/19 1315)      Gabrielle Logan

## 2019-09-17 NOTE — Transfer of Care (Signed)
Immediate Anesthesia Transfer of Care Note  Patient: Gabrielle Logan  Procedure(s) Performed: CESAREAN SECTION (N/A Abdomen)  Patient Location: PACU  Anesthesia Type:Spinal  Level of Consciousness: awake, alert , oriented and patient cooperative  Airway & Oxygen Therapy: Patient Spontanous Breathing  Post-op Assessment: Report given to RN and Post -op Vital signs reviewed and stable  Post vital signs: Reviewed and stable  Last Vitals:  Vitals Value Taken Time  BP 94/59 09/17/19 1222  Temp    Pulse 105 09/17/19 1222  Resp 20 09/17/19 1222  SpO2 100 % 09/17/19 1222  Vitals shown include unvalidated device data.  Last Pain:  Vitals:   09/17/19 1222  TempSrc:   PainSc: (P) 0-No pain         Complications: No apparent anesthesia complications

## 2019-09-17 NOTE — Anesthesia Procedure Notes (Signed)
Spinal  Patient location during procedure: OR Start time: 09/17/2019 10:54 AM End time: 09/17/2019 10:57 AM Staffing Performed: anesthesiologist  Anesthesiologist: Cecile Hearing, MD Preanesthetic Checklist Completed: patient identified, IV checked, risks and benefits discussed, surgical consent, monitors and equipment checked, pre-op evaluation and timeout performed Spinal Block Patient position: sitting Prep: DuraPrep and site prepped and draped Patient monitoring: continuous pulse ox and blood pressure Approach: midline Location: L3-4 Injection technique: single-shot Needle Needle type: Pencan  Needle gauge: 24 G Assessment Sensory level: T6 Additional Notes Functioning IV was confirmed and monitors were applied. Sterile prep and drape, including hand hygiene, mask and sterile gloves were used. The patient was positioned and the spine was prepped. The skin was anesthetized with lidocaine.  Free flow of clear CSF was obtained prior to injecting local anesthetic into the CSF.  The spinal needle aspirated freely following injection.  The needle was carefully withdrawn.  The patient tolerated the procedure well. Consent was obtained prior to procedure with all questions answered and concerns addressed. Risks including but not limited to bleeding, infection, nerve damage, paralysis, failed block, inadequate analgesia, allergic reaction, high spinal, itching and headache were discussed and the patient wished to proceed.   Arrie Aran, MD

## 2019-09-17 NOTE — Discharge Summary (Signed)
Postpartum Discharge Summary       Patient Name: Gabrielle Logan DOB: 05-Jul-1986 MRN: [id number]  Date of admission: 09/17/2019 Delivering Provider: Jonnie Kind   Date of discharge: 09/20/2019  Admitting diagnosis: History of cesarean section [Z98.891] Intrauterine pregnancy: [redacted]w[redacted]d    Secondary diagnosis:  Active Problems:   History of C-section   Group beta Strep positive   [redacted] weeks gestation of pregnancy   History of cesarean section  Additional problems: None     Discharge diagnosis: Term Pregnancy Delivered                                                                                                Post partum procedures:none  Augmentation: NA  Complications: None  Hospital course:  Sceduled C/S   34y.o. yo [id number] 36w3das admitted to the hospital 09/17/2019 for scheduled cesarean section with the following indication:Elective Repeat.  Membrane Rupture Time/Date: 11:23 AM ,09/17/2019   Patient delivered a Viable infant.09/17/2019  Details of operation can be found in separate operative note.  Pateint had an uncomplicated postpartum course.  She is ambulating, tolerating a regular diet, passing flatus, and urinating well. Patient is discharged home in stable condition on  09/20/19        Delivery time: 11:23 AM    Magnesium Sulfate received: No BMZ received: No Rhophylac:No MMR:No Transfusion:No  Physical exam  Vitals:   09/19/19 0513 09/19/19 1400 09/19/19 2154 09/20/19 0606  BP: 114/71 118/78 126/73 125/73  Pulse: 90 86 81 70  Resp: _0 Temp: 98 F (36.7 C) 97.8 F (36.6 C) 98 F (36.7 C) 98.3 F (36.8 C)  TempSrc: Oral Oral Oral Oral  SpO2: 96%  100% 100%  Weight:      Height:       General: alert, cooperative and no distressl.  Voiding large amounts of urine per pt Lochia: appropriate Uterine Fundus: firm Incision: Healing well with no significant drainage, No significant erythema, Dressing is clean, dry, and intact DVT  Evaluation: No evidence of DVT seen on physical exam. 1+ edema, pt requests "fluid pill" d/t discomfort.   Labs: Lab Results  Component Value Date   WBC 16.9 (H) 09/18/2019   HGB 9.9 (L) 09/18/2019   HCT 29.7 (L) 09/18/2019   MCV 87.1 09/18/2019   PLT 287 09/18/2019   CMP Latest Ref Rng & Units 09/17/2019  Glucose 70 - 99 mg/dL -  BUN 6 - 20 mg/dL -  Creatinine 0.44 - 1.00 mg/dL 0.84  Sodium 135 - 145 mmol/L -  Potassium 3.5 - 5.1 mmol/L -  Chloride 98 - 111 mmol/L -  CO2 22 - 32 mmol/L -  Calcium 8.9 - 10.3 mg/dL -  Total Protein 6.5 - 8.1 g/dL -  Total Bilirubin 0.3 - 1.2 mg/dL -  Alkaline Phos 38 - 126 U/L -  AST 15 - 41 U/L -  ALT 0 - 44 U/L -   Edinburgh Score: Edinburgh Postnatal Depression Scale Screening Tool 09/18/2019  I have been able to laugh and see the funny side of  things. (No Data)    Discharge instruction: per After Visit Summary and "Baby and Me Booklet".  After visit meds:  Allergies as of 09/20/2019      Reactions   Sulfa Antibiotics Anaphylaxis, Rash   Hard to breathe   Hydrocodone Nausea And Vomiting, Other (See Comments)   Light-headedness   Latex Rash   Mild skin irritation      Medication List    STOP taking these medications   sertraline 25 MG tablet Commonly known as: Zoloft     TAKE these medications   Accu-Chek FastClix Lancets Misc 1 each by Percutaneous route 4 (four) times daily.   Accu-Chek Guide Me w/Device Kit 1 each by Does not apply route 4 (four) times daily.   Accu-Chek Guide test strip Generic drug: glucose blood Use as instructed to check blood sugar 4 times daily   acetaminophen 325 MG tablet Commonly known as: TYLENOL Take 2 tablets (650 mg total) by mouth every 6 (six) hours as needed for mild pain (temperature > 101.5.). What changed: reasons to take this   Blood Pressure Monitor Misc For regular home bp monitoring during pregnancy   hydrochlorothiazide 25 MG tablet Commonly known as: HYDRODIURIL Take 0.5  tablets (12.5 mg total) by mouth daily.   hydrOXYzine 50 MG tablet Commonly known as: ATARAX/VISTARIL Take 1 tablet (50 mg total) by mouth 3 (three) times daily as needed for anxiety.   ibuprofen 800 MG tablet Commonly known as: ADVIL Take 1 tablet (800 mg total) by mouth every 6 (six) hours.   Melatonin 5 MG Chew Chew 2 each by mouth at bedtime as needed (For sleep.).   norethindrone 0.35 MG tablet Commonly known as: Ortho Micronor Take 1 tablet (0.35 mg total) by mouth daily.   oxyCODONE 5 MG immediate release tablet Commonly known as: Oxy IR/ROXICODONE Take 1-2 tablets (5-10 mg total) by mouth every 4 (four) hours as needed for moderate pain.   pantoprazole 20 MG tablet Commonly known as: Protonix Take 1 tablet (20 mg total) by mouth daily.   PARoxetine 20 MG tablet Commonly known as: PAXIL Take 1 tablet (20 mg total) by mouth daily.   prenatal multivitamin Tabs tablet Take 1 tablet by mouth daily at 12 noon.       Diet: routine diet  Activity: Advance as tolerated. Pelvic rest for 6 weeks.   Outpatient follow up:4 weeks Follow up Appt: Future Appointments  Date Time Provider Lucas  09/24/2019  2:30 PM Jonnie Kind, MD [facility]   Follow up Visit: Follow-up Information    [facility] Follow up on 09/24/2019.   Specialty: Obstetrics and Gynecology Why: for incision check Contact information: [address] [place] [phone]            Please schedule this patient for Postpartum visit in: 4 weeks with the following provider: Any provider Virtual For C/S patients schedule nurse incision check in weeks 2 weeks: yes Low risk pregnancy complicated by: 3 prior sections Delivery mode:  CS Anticipated Birth Control:  Nexplanon PP Procedures needed: Incision check  Schedule Integrated BH visit: no     Newborn Data: Live born female  Birth Weight: 8# 15 oz APGAR: 44, 9  Newborn  Delivery   Birth date/time: 09/17/2019 11:23:00 Delivery type: C-Section, Low Transverse Trial of labor: No C-section categorization: Repeat      Baby Feeding: Breast and bottle Disposition:home with mother   09/20/2019 Christin Fudge, CNM

## 2019-09-17 NOTE — H&P (Signed)
Obstetric Preoperative History and Physical  Gabrielle Logan is a 34 y.o. [id number] with IUP at 36w3dpresenting for scheduled cesarean section.  Reports good fetal movement, no bleeding, no contractions, no leaking of fluid.  No acute preoperative concerns.    Cesarean Section Indication: 3 prior c-sections  Prenatal Course Source of Care: [facility] with onset of care at 6 weeks Pregnancy complications or risks: Patient Active Problem List   Diagnosis Date Noted  . [redacted] weeks gestation of pregnancy 09/17/2019  . Group beta Strep positive 09/11/2019  . Family history of gastroschisis 04/23/2019  . Low-lying placenta 04/23/2019  . Supervision of normal pregnancy 03/12/2019  . History of C-section 01/17/2019  . Family history of Down syndrome 01/17/2019  . Family history of Wolff-Parkinson-White (WPW) syndrome 01/17/2019   She plans to breastfeed She desires oral progesterone-only contraceptive for postpartum contraception.   Prenatal labs and studies: ABO, Rh: --/--/A POS, A POS Performed at [facility] [address], [place] [address] [phone]) Antibody: NEG (02/14 0845) Rubella: 6.68 (08/11 1653) RPR: NON REACTIVE (02/14 0842)  HBsAg: Negative (08/11 1653)  HIV: Non Reactive (11/19 0933)  GBS:--/Positive (02/08 1330) 2 hr Glucola  WNL Genetic screening normal Anatomy UKoreanormal  Prenatal Transfer Tool  Maternal Diabetes: No Genetic Screening: Normal Maternal Ultrasounds/Referrals: Normal Fetal Ultrasounds or other Referrals:  None Maternal Substance Abuse:  No Significant Maternal Medications:  None Significant Maternal Lab Results: Group B Strep positive  Past Medical History:  Diagnosis Date  . ADHD   . Anxiety   . Depression   . OCD (obsessive compulsive disorder)   . PTSD (post-traumatic stress disorder)   . Thyroid disease    hyperthyroid    Past Surgical History:  Procedure Laterality Date  . CESAREAN SECTION  2003,2006,2011    OB  History  Gravida Para Term Preterm AB Living  _0 SAB TAB Ectopic Multiple Live Births          3    # Outcome Date GA Lbr Len/2nd Weight Sex Delivery Anes PTL Lv  4 Current           3 Term 09/24/09 [id number] g M CS-LTranv Spinal N LIV  2 Term 04/02/05 [id number] g M CS-LTranv Spinal  DEC     Complications: Down syndrome  1 Preterm 02/02/02 [id number] g M CS-LTranv Spinal N LIV     Complications: Gastroschisis    Social History   Socioeconomic History  . Marital status: DomeSoil scientistSpouse name: JareAdonis BrookNumber of children: 3  . Years of education: Not on file  . Highest education level: GED or equivalent  Occupational History  . Not on file  Tobacco Use  . Smoking status: Never Smoker  . Smokeless tobacco: Current User    Types: Snuff  Substance and Sexual Activity  . Alcohol use: Not Currently    Comment: rarely  . Drug use: Not Currently    Types: Marijuana  . Sexual activity: Yes    Birth control/protection: None  Other Topics Concern  . Not on file  Social History Narrative  . Not on file   Social Determinants of Health   Financial Resource Strain: Low Risk   . Difficulty of Paying Living Expenses: Not very hard  Food Insecurity: No Food Insecurity  . Worried About RunnCharity fundraiserthe Last Year: Never true  .  Ran Out of Food in the Last Year: Never true  Transportation Needs: No Transportation Needs  . Lack of Transportation (Medical): No  . Lack of Transportation (Non-Medical): No  Physical Activity: Insufficiently Active  . Days of Exercise per Week: 1 day  . Minutes of Exercise per Session: 10 min  Stress: No Stress Concern Present  . Feeling of Stress : Only a little  Social Connections: Unknown  . Frequency of Communication with Friends and Family: More than three times a week  . Frequency of Social Gatherings with Friends and Family: Twice a week  . Attends Religious Services: 1 to 4 times per year  . Active  Member of Clubs or Organizations: No  . Attends Archivist Meetings: Never  . Marital Status: Not on file    Family History  Problem Relation Age of Onset  . Thyroid disease Maternal Grandmother   . Other Father        heart issues  . Thyroid disease Mother   . Hypertension Mother   . Other Son        gastroschisis; wolf parkinson's white syndrome  . Down syndrome Son        passed away at age 53    Medications Prior to Admission  Medication Sig Dispense Refill Last Dose  . acetaminophen (TYLENOL) 325 MG tablet Take 650 mg by mouth every 6 (six) hours as needed for mild pain or headache.     . Melatonin 5 MG CHEW Chew 2 each by mouth at bedtime as needed (For sleep.).     Marland Kitchen Prenatal Vit-Fe Fumarate-FA (PRENATAL MULTIVITAMIN) TABS tablet Take 1 tablet by mouth daily at 12 noon.     . Accu-Chek FastClix Lancets MISC 1 each by Percutaneous route 4 (four) times daily. (Patient not taking: Reported on 09/09/2019) 100 each 12   . Blood Glucose Monitoring Suppl (ACCU-CHEK GUIDE ME) w/Device KIT 1 each by Does not apply route 4 (four) times daily. (Patient not taking: Reported on 09/09/2019) 1 kit 0   . Blood Pressure Monitor MISC For regular home bp monitoring during pregnancy 1 each 0   . glucose blood (ACCU-CHEK GUIDE) test strip Use as instructed to check blood sugar 4 times daily (Patient not taking: Reported on 09/09/2019) 100 each 12   . pantoprazole (PROTONIX) 20 MG tablet Take 1 tablet (20 mg total) by mouth daily. (Patient not taking: Reported on 08/20/2019) 30 tablet 6 Not Taking at Unknown time  . sertraline (ZOLOFT) 25 MG tablet Take 1 tablet (25 mg total) by mouth daily. (Patient not taking: Reported on 09/05/2019) 30 tablet 6 Not Taking at Unknown time    Allergies  Allergen Reactions  . Sulfa Antibiotics Anaphylaxis and Rash    Hard to breathe  . Hydrocodone Nausea And Vomiting and Other (See Comments)    Light-headedness  . Latex Rash    Mild skin irritation     Review of Systems: Pertinent items noted in HPI and remainder of comprehensive ROS otherwise negative.  Physical Exam: BP (!) 154/76 Comment: pt very anxious for surgery  Pulse 96   Temp 98.1 F (36.7 C) (Oral)   Resp 18   Ht _0  (1.6 m)   Wt 97.6 kg   LMP 12/07/2018   BMI 38.12 kg/m  FHR by Doppler: 135 bpm CONSTITUTIONAL: Well-developed, well-nourished female in no acute distress.  HENT:  Normocephalic, atraumatic, External right and left ear normal. Oropharynx is clear and moist EYES: Conjunctivae and EOM are  normal. No scleral icterus.  NECK: Normal range of motion, supple, no masses SKIN: Skin is warm and dry. No rash noted. Not diaphoretic. No erythema. No pallor. [place]: Alert and oriented to person, place, and time. Normal reflexes, muscle tone coordination. No cranial nerve deficit noted. PSYCHIATRIC: Normal mood and affect. Normal behavior. Normal judgment and thought content. CARDIOVASCULAR: Normal heart rate noted RESPIRATORY: Effort and breath sounds normal, no problems with respiration noted ABDOMEN: Soft, nontender, nondistended, gravid. Well-healed Pfannenstiel incision. PELVIC: Deferred MUSCULOSKELETAL: Normal range of motion. No edema and no tenderness. 2+ distal pulses.   Pertinent Labs/Studies:   Results for orders placed or performed during the hospital encounter of 09/15/19 (from the past 72 hour(s))  SARS CORONAVIRUS 2 (TAT 6-24 HRS) Nasopharyngeal Nasopharyngeal Swab     Status: None   Collection Time: 09/15/19  8:37 AM   Specimen: Nasopharyngeal Swab  Result Value Ref Range   SARS Coronavirus 2 NEGATIVE NEGATIVE    Comment: (NOTE) SARS-CoV-2 target nucleic acids are NOT DETECTED. The SARS-CoV-2 RNA is generally detectable in upper and lower respiratory specimens during the acute phase of infection. Negative results do not preclude SARS-CoV-2 infection, do not rule out co-infections with other pathogens, and should not be used as the sole  basis for treatment or other patient management decisions. Negative results must be combined with clinical observations, patient history, and epidemiological information. The expected result is Negative. Fact Sheet for Patients: SugarRoll.be Fact Sheet for Healthcare Providers: https://www.woods-mathews.com/ This test is not yet approved or cleared by the [place] FDA and  has been authorized for detection and/or diagnosis of SARS-CoV-2 by FDA under an Emergency Use Authorization (EUA). This EUA will remain  in effect (meaning this test can be used) for the duration of the COVID-19 declaration under Section 56 4(b)(1) of the Act, 21 U.S.C. section 360bbb-3(b)(1), unless the authorization is terminated or revoked sooner. Performed at [facility], [place] [address] [place], [place] [address]   CBC     Status: Abnormal   Collection Time: 09/15/19  8:42 AM  Result Value Ref Range   WBC 15.4 (H) 4.0 - 10.5 K/uL   RBC 4.64 3.87 - 5.11 MIL/uL   Hemoglobin 13.4 12.0 - 15.0 g/dL   HCT 40.1 36.0 - 46.0 %   MCV 86.4 80.0 - 100.0 fL   MCH 28.9 26.0 - 34.0 pg   MCHC 33.4 30.0 - 36.0 g/dL   RDW 13.2 11.5 - 15.5 %   Platelets 284 150 - 400 K/uL   nRBC 0.0 0.0 - 0.2 %    Comment: Performed at [facility], [place] [address] [place], [place] [address]  Comprehensive metabolic panel     Status: Abnormal   Collection Time: 09/15/19  8:42 AM  Result Value Ref Range   Sodium 137 135 - 145 mmol/L   Potassium 3.6 3.5 - 5.1 mmol/L   Chloride 106 98 - 111 mmol/L   CO2 20 (L) 22 - 32 mmol/L   Glucose, Bld 98 70 - 99 mg/dL   BUN 10 6 - 20 mg/dL   Creatinine, Ser 0.87 0.44 - 1.00 mg/dL   Calcium 9.0 8.9 - 10.3 mg/dL   Total Protein 6.6 6.5 - 8.1 g/dL   Albumin 2.8 (L) 3.5 - 5.0 g/dL   AST 22 15 - 41 U/L   ALT 17 0 - 44 U/L   Alkaline Phosphatase 148 (H) 38 - 126 U/L   Total Bilirubin 0.4 0.3 - 1.2 mg/dL  GFR calc non Af Amer >60 >60 mL/min    GFR calc Af Amer >60 >60 mL/min   Anion gap 11 5 - 15    Comment: Performed at [facility] [address] [place], [place] [address]  RPR     Status: None   Collection Time: 09/15/19  8:42 AM  Result Value Ref Range   RPR Ser Ql NON REACTIVE NON REACTIVE    Comment: Performed at [facility], [place] [address], [place], [place] [address]  Type and screen [place]     Status: None   Collection Time: 09/15/19  8:45 AM  Result Value Ref Range   ABO/RH(D) A POS    Antibody Screen NEG    Sample Expiration      09/18/2019,2359 Performed at [facility], [place] [address], [place], [place] [address]   ABO/Rh     Status: None   Collection Time: 09/15/19  8:45 AM  Result Value Ref Range   ABO/RH(D)      A POS Performed at [facility] [address] [place], [place] [address]     Assessment and Plan: KATARINA RIEBE is a 34 y.o. [phone] at 65w3dbeing admitted for scheduled cesarean section. The risks of cesarean section discussed with the patient included but were not limited to: bleeding which may require transfusion or reoperation; infection which may require antibiotics; injury to bowel, bladder, ureters or other surrounding organs; injury to the fetus; need for additional procedures including hysterectomy in the event of a life-threatening hemorrhage; placental abnormalities with subsequent pregnancies, incisional problems, thromboembolic phenomenon and other postoperative/anesthesia complications. The patient concurred with the proposed plan, giving informed written consent for the procedure. Patient has been NPO since last night she will remain NPO for procedure. Anesthesia and OR aware. Preoperative prophylactic antibiotics and SCDs ordered on call to the OR. To OR when ready.   Pregnancy Complications: Anxiety/Depression - cont Zoloft. 3 prior c-sections Contraception: Possibly nexplanon Circumcision: outpatient   CBarrington Ellison MD [facility] Fellow, FSouth Florida Evaluation And Treatment Centerfor WM[facility] CRetsof

## 2019-09-18 LAB — CBC
HCT: 29.7 % — ABNORMAL LOW (ref 36.0–46.0)
Hemoglobin: 9.9 g/dL — ABNORMAL LOW (ref 12.0–15.0)
MCH: 29 pg (ref 26.0–34.0)
MCHC: 33.3 g/dL (ref 30.0–36.0)
MCV: 87.1 fL (ref 80.0–100.0)
Platelets: 287 10*3/uL (ref 150–400)
RBC: 3.41 MIL/uL — ABNORMAL LOW (ref 3.87–5.11)
RDW: 13.2 % (ref 11.5–15.5)
WBC: 16.9 10*3/uL — ABNORMAL HIGH (ref 4.0–10.5)
nRBC: 0 % (ref 0.0–0.2)

## 2019-09-18 MED ORDER — FERROUS SULFATE 325 (65 FE) MG PO TABS
325.0000 mg | ORAL_TABLET | ORAL | Status: DC
  Administered 2019-09-18 – 2019-09-20 (×2): 325 mg via ORAL
  Filled 2019-09-18 (×2): qty 1

## 2019-09-18 MED ORDER — PAROXETINE HCL 10 MG PO TABS
10.0000 mg | ORAL_TABLET | Freq: Every day | ORAL | Status: DC
  Administered 2019-09-18: 14:00:00 10 mg via ORAL
  Filled 2019-09-18: qty 1

## 2019-09-18 MED ORDER — PAROXETINE HCL 20 MG PO TABS
20.0000 mg | ORAL_TABLET | Freq: Every day | ORAL | Status: DC
  Administered 2019-09-19 – 2019-09-20 (×2): 20 mg via ORAL
  Filled 2019-09-18 (×2): qty 1

## 2019-09-18 MED ORDER — HYDROXYZINE HCL 25 MG PO TABS
50.0000 mg | ORAL_TABLET | Freq: Three times a day (TID) | ORAL | Status: DC | PRN
  Administered 2019-09-18: 50 mg via ORAL
  Filled 2019-09-18: qty 2

## 2019-09-18 NOTE — Lactation Note (Signed)
This note was copied from a baby's chart. Lactation Consultation Note: Infant is 44 hours old and is at 6% wt loss.   [facility].  Mother wanted to have LC check infants latch.  Mother had infant in cradle hold . Infant was poorly latch. Observed that infant had only the upper part of the nipple in her mouth.   Mother advised to change position and use pillow support . Observed that mother is able to hand express large drops of colostrum.  Infant latched on in cross cradle hold. Infant was observed for 10 mins with good burst of suckling and audible swallows.  Peds in room for exam.  Infant placed on the alternate breast in football hold. Infant latched well with good depth. Mother reports that latch feels better. Assist mother with flanging infants lips for wider gape.  Observed 10 mins of the feeding. Infant remains at the breast when I left the room.  LC spoke with Dr Margo Aye about observation of two feedings.  Encouraged mother to continue to cue base feed. She was given a harmony hand pump by staff nurse with instructions,  Mother was advised to hand express and pump to offer infant ebm after breastfeeding.  Mother wants to be discharged today. She was advised in treatment and prevention of engorgement . Mother to breastfeed infant 8-12 or or more times in 24 hours. Encouraged frequent STS.  Mother is aware of available LC services at [facility].  Patient Name: Gabrielle Logan CZYSA'Y Date: 09/18/2019 Reason for consult: Follow-up assessment   Maternal Data    Feeding Feeding Type: Breast Fed  LATCH Score Latch: Grasps breast easily, tongue down, lips flanged, rhythmical sucking.  Audible Swallowing: Spontaneous and intermittent  Type of Nipple: Everted at rest and after stimulation  Comfort (Breast/Nipple): Soft / non-tender  Hold (Positioning): Assistance needed to correctly position infant at breast and maintain latch.  LATCH Score:  9  Interventions Interventions: Assisted with latch;Skin to skin;Hand express;Breast compression;Adjust position;Support pillows;Position options;Hand pump  Lactation Tools Discussed/Used     Consult Status Consult Status: Follow-up Date: 09/19/19 Follow-up type: In-patient    Stevan Born [facility] 09/18/2019, 11:23 AM

## 2019-09-18 NOTE — Progress Notes (Addendum)
I have read and agree with the residents note below.   Interim progress note:   Patient is day 1 status post C-section.  Patient states her anxiety/depression was previously treated with Klonopin and Paxil, but has been without treatment for the past 6 years.  She has been requesting Klonopin for increased anxiety.  Consulted [facility] regarding patient's history of Anxiety/Depression, which according to note from social work stems from PTSD from many years ago.  To help with patient's anxiety/depression she was restarted on Paxil at 10 mg daily, was recently increased up to 20 mg daily starting 2/18. She has been also requesting Klonopin for anxiety.  As patient has not been taking this medication for many years, and is therefore not at risk of going into benzo withdrawal, we prefer to start the patient on hydroxyzine 50 mg 3 times daily as needed for anxiety.  Behavioral health was contacted (spoke with Ahlayah Tarkowski), who agreed to avoid benzodiazepines, agreed with plan to continue Paxil and hydroxyzine.  Recommended that patient should seek guidance from outpatient psychiatrist should she continue to desire to restart Klonopin.   Peggyann Shoals, DO [facility] Medicine, PGY-2 09/18/2019 10:06 PM

## 2019-09-18 NOTE — Lactation Note (Signed)
This note was copied from a baby's chart. Lactation Consultation Note Baby 13 hrs old. Mom stated baby is BF well. Baby laying in bed, mom wrestling w/covers. Mom seems a little flustered when LC first walked into room. Mom was polite. Mom stated she pumped and bottle fed first 2 children, 3-4 months and BF her 3rd child for 6 months and plans to pump, breast and bottle feed this baby. Mom stated she doesn't have any questions or concerns at this time. Mom stated she knows how to hand express and has colostrum. Newborn behavior, STS, I&O, milk storage, breast massage discussed. Baby having emesis, LC sat baby up, patting on back, used suction bulb to clear mouth. Baby done this several times while in room. Encouraged to call for assistance or questions. Lactation brochure given.  Patient Name: Gabrielle Logan CBSWH'Q Date: 09/18/2019 Reason for consult: Initial assessment;Term   Maternal Data Does the patient have breastfeeding experience prior to this delivery?: Yes  Feeding Feeding Type: Breast Fed  LATCH Score Latch: Grasps breast easily, tongue down, lips flanged, rhythmical sucking.  Audible Swallowing: A few with stimulation  Type of Nipple: Everted at rest and after stimulation  Comfort (Breast/Nipple): Soft / non-tender  Hold (Positioning): No assistance needed to correctly position infant at breast.  LATCH Score: 9  Interventions Interventions: Breast feeding basics reviewed  Lactation Tools Discussed/Used WIC Program: Yes   Consult Status Consult Status: Follow-up Date: 09/18/19 Follow-up type: In-patient    Gabrielle Logan, Diamond Nickel 09/18/2019, 1:13 AM

## 2019-09-18 NOTE — Progress Notes (Addendum)
NT reported to me that when she went into the room the patient was very irritable and anxious and asking for something for her anxiety. I went into the room to talk to the patient and she does appear anxious and pt states she is "irritated and needs something for her anxiety, paxil does not help and takes too long to kick in. In the past low dose Clonazepam helped." I informed her that I would call the doctor and see what else we could get for her. I spoke to Dr. Salomon Mast and she put new orders in.
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
## 2019-09-18 NOTE — Progress Notes (Signed)
Pt agrees to Stay. Paxil ordered.

## 2019-09-18 NOTE — Progress Notes (Signed)
Patient seems very anxious about things. Dr. Dareen Piano and SSW aware that she needs some type of nerve medicine per her. She declined the Zoloft, she states it dont work. All questions answered and plan of care explained.

## 2019-09-18 NOTE — Progress Notes (Signed)
POSTPARTUM PROGRESS NOTE  Subjective: Gabrielle Logan is a 34 y.o. [id number] POD#1 s/p rLTCS at [redacted]w[redacted]d.  She reports she doing well. No acute events overnight. She denies any problems with ambulating, voiding or po intake. Denies nausea or vomiting. She has  passed flatus. Pain is well controlled.  Lochia is appropriate.  Objective: Blood pressure 105/64, pulse 60, temperature 97.6 F (36.4 C), temperature source Oral, resp. rate 18, height 5\' 3"  (1.6 m), weight 97.6 kg, last menstrual period 12/07/2018, SpO2 97 %, unknown if currently breastfeeding.  Physical Exam:  General: alert, cooperative and no distress Chest: no respiratory distress Abdomen: soft, non-tender. Dressing dry, clean and intact Uterine Fundus: firm, appropriately tender Extremities: No calf swelling or tenderness  No edema  Recent Labs    09/17/19 1533 09/18/19 0530  HGB 11.8* 9.9*  HCT 35.2* 29.7*    Assessment/Plan: Gabrielle Logan is a 34 y.o. 31 POD#1 s/p rLTCS at 106w3d.  Routine Postpartum Care: Doing well, pain well-controlled.  -- Continue routine care, lactation support  -- Contraception: POPs -- Feeding: breast -- Hgb 13.4>11.8>9.9, start PO Fe  Dispo: Plan for discharge tomorrow.  [redacted]w[redacted]d, DO OB/GYN Fellow, [facility] for [facility]

## 2019-09-18 NOTE — Progress Notes (Signed)
Patient ID: Gabrielle Logan, female   DOB: 01-29-1986, 34 y.o.   MRN: [id number] RN notified CNM of pt request for D/C today. Pt tolerating PO, passing flatus, stable VS and labs. Is 24 hours post-repeat C/S. CNM recommends against it, but would not consider it AMA if she left. Baby has not been D/C'd--awaiting 24 hour eval. Pt also discussing Hx anxiety w/ RN. Stated Gabrielle Logan has worked in past. Started Zoloft 04/2019, but did not help. Also used Paxil in the past which worked per CSW note. CNM not comfortable Rx-ing Benzos. Will discuss restarting Paxil (L2 during lactation per [facility]).

## 2019-09-19 MED ORDER — OXYCODONE HCL 5 MG PO TABS: 5.0000 mg | ORAL_TABLET | ORAL | 0 refills | Status: DC | PRN

## 2019-09-19 MED ORDER — PAROXETINE HCL 20 MG PO TABS: 20.0000 mg | ORAL_TABLET | Freq: Every day | ORAL | 3 refills | Status: DC

## 2019-09-19 MED ORDER — NORETHINDRONE 0.35 MG PO TABS: 1.0000 | ORAL_TABLET | Freq: Every day | ORAL | 11 refills | Status: DC

## 2019-09-19 MED ORDER — ACETAMINOPHEN 325 MG PO TABS: 650.0000 mg | ORAL_TABLET | Freq: Four times a day (QID) | ORAL | 0 refills | Status: DC | PRN

## 2019-09-19 MED ORDER — HYDROXYZINE HCL 50 MG PO TABS: 50.0000 mg | ORAL_TABLET | Freq: Three times a day (TID) | ORAL | 0 refills | Status: DC | PRN

## 2019-09-19 MED ORDER — IBUPROFEN 800 MG PO TABS: 800.0000 mg | ORAL_TABLET | Freq: Four times a day (QID) | ORAL | 0 refills | Status: DC

## 2019-09-19 MED FILL — IBUPROFEN 800 MG TAB: 800 | 7 days supply | Qty: 30 | Fill #0

## 2019-09-19 MED FILL — oxyCODONE HCL 5 MG TABS: 5 | 3 days supply | Qty: 30 | Fill #0

## 2019-09-19 MED FILL — PARoxetine HCL 20 MG TABS: 20 | 30 days supply | Qty: 30 | Fill #0

## 2019-09-19 MED FILL — ACETAMINOPHEN 325 MG TABS: 325 | 4 days supply | Qty: 30 | Fill #0

## 2019-09-19 MED FILL — NORETHINDRONE 0.35 MG TAB: 0.35 | 28 days supply | Qty: 28 | Fill #0

## 2019-09-19 MED FILL — HYDROXYZINE PAM 25 MG CAP: 25 | 10 days supply | Qty: 60 | Fill #0

## 2019-09-19 NOTE — Lactation Note (Signed)
This note was copied from a baby's chart. Lactation Consultation Note  Patient Name: Boy Nicolena Schurman APOLI'D Date: 09/19/2019 Reason for consult: Follow-up assessment;Infant weight loss Baby is 47 hours old/9% weight loss.  Mom states she no longer desires to put baby to breast.  She would like to pump and bottle feed.  She reports milk is leaking.  Symphony pump set up and initiated.  Instructed to pump every 3 hours x 15 minutes.  Instructed on use, cleaning and EBM storage.  Mom has a single electric pump at home.  She has a history of an abundant milk supply.  Parents started formula feeding baby last night.  Questions answered.  Encouraged to call for assist prn.  Maternal Data    Feeding Feeding Type: Formula Nipple Type: Slow - flow  LATCH Score                   Interventions    Lactation Tools Discussed/Used Pump Review: Setup, frequency, and cleaning;Milk Storage Initiated by:: [email or web address] Date initiated:: 09/19/19   Consult Status Consult Status: Follow-up Date: 09/20/19 Follow-up type: In-patient    Huston Foley 09/19/2019, 11:13 AM

## 2019-09-19 NOTE — Progress Notes (Signed)
POSTPARTUM PROGRESS NOTE  Subjective: Gabrielle Logan is a 34 y.o. [id number] POD#2 s/p rLTCS at [redacted]w[redacted]d.  She reports she doing well. No acute events overnight. She denies any problems with ambulating, voiding or po intake. Denies nausea or vomiting. She has passed flatus. Pain is well controlled.  Lochia is appropriate.  Anxiety better controlled on Paxil with Vistaril.  Objective: Blood pressure 114/71, pulse 90, temperature 98 F (36.7 C), temperature source Oral, resp. rate 17, height 5\' 3"  (1.6 m), weight 97.6 kg, last menstrual period 12/07/2018, SpO2 96 %, unknown if currently breastfeeding.  Physical Exam:  General: alert, cooperative and no distress Chest: no respiratory distress Abdomen: soft, non-tender  Uterine Fundus: firm, appropriately tender Extremities: No calf swelling or tenderness  No edema  Recent Labs    09/17/19 1533 09/18/19 0530  HGB 11.8* 9.9*  HCT 35.2* 29.7*    Assessment/Plan: Gabrielle Logan is a 34 y.o. 31 POD#2 s/p rLTCS at [redacted]w[redacted]d.  Routine Postpartum Care: Doing well, pain well-controlled.  -- Continue routine care, lactation support  -- Contraception: POPs -- Feeding: breast -- Hgb 11.8>9.9, On PO Fe -- Anxiety: Paxil 20 mg, Vistaril 50 mg TID PRN. Spoke with psychiatry last night who agrees with avoiding benzos as she has not needed them for 6 years. She should follow up with outpatient psych as needed.  Dispo: Plan for discharge tomorrow.  [redacted]w[redacted]d, DO OB/GYN Fellow, [facility] for [facility]

## 2019-09-19 NOTE — Discharge Instructions (Signed)

## 2019-09-20 MED ORDER — HYDROCHLOROTHIAZIDE 25 MG PO TABS: 12.5000 mg | ORAL_TABLET | Freq: Every day | ORAL | 0 refills | Status: DC

## 2019-09-20 MED FILL — HYDROCHLOROTHIAZIDE 12.5 MG: 12.5 | 14 days supply | Qty: 14 | Fill #0

## 2019-09-20 NOTE — Lactation Note (Signed)
This note was copied from a baby's chart. Lactation Consultation Note Mother is a P3, infant is hours old and is now at 7.5 % wt loss which is a gain of 1.3 ounces.  at 25 hours old.  Mother is active with [facility]. She reports that she attempt to phone [facility] yesterday to see about getting a pump. She reports that they were closed. Mother has a DEBP sat up at the bedside. Mother has a single battery operated pump at home and a harmony hand pump.   WIC referral sent to [facility] at mothers request.   Mother has approx 60 ml ebm at the bedside for the next feeding.   Plan of Care : Breastfeed infant with feeding cues Supplement infant with ebm/formula, according to supplemental guidelines. Pump using a DEBP after each feeding for 15-20 mins. Every 2-3 hours.  Mother to continue to cue base feed infant and feed at least 8-12 times or more in 24 hours and advised to allow for cluster feeding infant as needed.   Mother to continue to due STS. Mother is aware of available LC services at [facility], BFSG'S, OP Dept, and phone # for questions or concerns about breastfeeding.  Mother receptive to all teaching and plan of care.   Patient Name: Gabrielle Logan HTDSK'A Date: 09/20/2019 Reason for consult: Follow-up assessment   Maternal Data    Feeding Feeding Type: Bottle Fed - Formula Nipple Type: Slow - flow  LATCH Score                   Interventions    Lactation Tools Discussed/Used     Consult Status      Gabrielle Logan 09/20/2019, 9:25 AM

## 2019-09-24 ENCOUNTER — Other Ambulatory Visit: Payer: Self-pay

## 2019-09-24 ENCOUNTER — Ambulatory Visit (INDEPENDENT_AMBULATORY_CARE_PROVIDER_SITE_OTHER): Payer: Medicaid Other | Admitting: Obstetrics and Gynecology

## 2019-09-24 ENCOUNTER — Encounter: Payer: Self-pay | Admitting: Obstetrics and Gynecology

## 2019-09-24 VITALS — BP 127/80 | HR 74 | Ht 63.0 in | Wt 205.8 lb

## 2019-09-24 DIAGNOSIS — Z09 Encounter for follow-up examination after completed treatment for conditions other than malignant neoplasm: Secondary | ICD-10-CM

## 2019-09-24 DIAGNOSIS — Z98891 History of uterine scar from previous surgery: Secondary | ICD-10-CM

## 2019-09-24 DIAGNOSIS — O34211 Maternal care for low transverse scar from previous cesarean delivery: Secondary | ICD-10-CM

## 2019-09-24 NOTE — Progress Notes (Signed)
Patient ID: Gabrielle Logan, female   DOB: [birth year], 34 y.o.   MRN: [id number]  Subjective:  Gabrielle Logan is a 34 y.o. female now 1 weeks status post c section. She has had c sections before and notes that her pain is more manageable this time, ranging from 2-5 out of 10. She is breast and bottle feeding her baby.   Review of Systems Negative   Diet: negative   Bowel movements: normal. The patient's pain is controlled with alternating Ibuprofen and Tylenol. She has oxycodone but does not like to take it. She has taken half a pill about 3x.  Objective:  BP 127/80 (BP Location: Right Arm, Patient Position: Sitting, Cuff Size: Normal)   Pulse 74   Ht 5\' 3"  (1.6 m)   Wt 205 lb 12.8 oz (93.4 kg)   LMP 12/07/2018   Breastfeeding Yes   BMI 36.46 kg/m  General:Well developed, well nourished.  No acute distress. Abdomen: Bowel sounds normal, soft, non-tender. Pelvic Exam: DEFERRED  Incision(s): Healing well, no drainage, no erythema, no hernia, no swelling, no dehiscence  Assessment:  Post-Op 1 week s/p c section.  Doing well postoperatively.   Plan:  1. Wound care discussed. 2.   Current medications. 3.   Activity restrictions: no overhead lifting 4.   Return to work: not applicable. 5.   Follow up in 4 weeks.  By signing my name below, I, 02/06/2019, attest that this documentation has been prepared under the direction and in the presence of Pietro Cassis, MD. Electronically Signed: Tilda Burrow, Medical Scribe. 09/24/19. 2:47 PM.  I personally performed the services described in this documentation, which was SCRIBED in my presence. The recorded information has been reviewed and considered accurate. It has been edited as necessary during review. 09/26/19, MD

## 2019-10-04 ENCOUNTER — Telehealth: Payer: Self-pay | Admitting: *Deleted

## 2019-10-04 ENCOUNTER — Other Ambulatory Visit: Payer: Self-pay

## 2019-10-04 ENCOUNTER — Ambulatory Visit (INDEPENDENT_AMBULATORY_CARE_PROVIDER_SITE_OTHER): Payer: Medicaid Other | Admitting: Obstetrics & Gynecology

## 2019-10-04 ENCOUNTER — Encounter: Payer: Self-pay | Admitting: Obstetrics & Gynecology

## 2019-10-04 VITALS — BP 114/73 | HR 103 | Ht 63.0 in | Wt 200.5 lb

## 2019-10-04 DIAGNOSIS — O8600 Infection of obstetric surgical wound, unspecified: Secondary | ICD-10-CM

## 2019-10-04 DIAGNOSIS — R300 Dysuria: Secondary | ICD-10-CM

## 2019-10-04 DIAGNOSIS — T8149XA Infection following a procedure, other surgical site, initial encounter: Secondary | ICD-10-CM

## 2019-10-04 LAB — POCT URINALYSIS DIPSTICK
Glucose, UA: NEGATIVE
Ketones, UA: NEGATIVE
Nitrite, UA: NEGATIVE
Protein, UA: POSITIVE — AB

## 2019-10-04 MED ORDER — AMOXICILLIN-POT CLAVULANATE 875-125 MG PO TABS: 1.0000 | ORAL_TABLET | Freq: Two times a day (BID) | ORAL | 0 refills | Status: DC

## 2019-10-04 NOTE — Telephone Encounter (Signed)
Patient states she is concerned her incision is infected.  She has noticed redness, warmth and tenderness to the area.  She is also is having some vaginal discharge.  Advised to come to our office for eval.  Pt verbalized understanding and will come now.

## 2019-10-04 NOTE — Progress Notes (Signed)
  HPI: Patient returns for routine postoperative follow-up having undergone repeat c section on 09/17/19.  The patient's immediate postoperative recovery has been unremarkable. Since hospital discharge the patient reports redness of the incision beginning 26 hours ago.   Current Outpatient Medications: .  acetaminophen (TYLENOL) 325 MG tablet, Take 2 tablets (650 mg total) by mouth every 6 (six) hours as needed for mild pain (temperature > 101.5.)., Disp: 30 tablet, Rfl: 0 .  Blood Pressure Monitor MISC, For regular home bp monitoring during pregnancy, Disp: 1 each, Rfl: 0 .  hydrOXYzine (ATARAX/VISTARIL) 50 MG tablet, Take 1 tablet (50 mg total) by mouth 3 (three) times daily as needed for anxiety., Disp: 30 tablet, Rfl: 0 .  ibuprofen (ADVIL) 800 MG tablet, Take 1 tablet (800 mg total) by mouth every 6 (six) hours., Disp: 30 tablet, Rfl: 0 .  PARoxetine (PAXIL) 20 MG tablet, Take 1 tablet (20 mg total) by mouth daily., Disp: 30 tablet, Rfl: 3 .  Prenatal Vit-Fe Fumarate-FA (PRENATAL MULTIVITAMIN) TABS tablet, Take 1 tablet by mouth daily at 12 noon., Disp: , Rfl:  .  amoxicillin-clavulanate (AUGMENTIN) 875-125 MG tablet, Take 1 tablet by mouth 2 (two) times daily., Disp: 20 tablet, Rfl: 0 .  norethindrone (ORTHO MICRONOR) 0.35 MG tablet, Take 1 tablet (0.35 mg total) by mouth daily. (Patient not taking: Reported on 09/24/2019), Disp: 1 Package, Rfl: 11  No current facility-administered medications for this visit.    Blood pressure 114/73, pulse (!) 103, height 5\' 3"  (1.6 m), weight 200 lb 8 oz (90.9 kg), last menstrual period 12/07/2018, currently breastfeeding.  Physical Exam: Incision itself looks good However the skin just above is erythematous, has peau d'orange changes and is warm consistent with lymphatic compression from her pannus which has gotten secondarily infected probably with staph but primarily this is lymphatic compression and all of the biochemical debris of the healing  process is layering out dependently leading to these changes  With that said will cover with augmntin  Diagnostic Tests:   Pathology:   Impression: Post op cellulitis due to lymphatic compression post op  Plan: Meds ordered this encounter  Medications  . amoxicillin-clavulanate (AUGMENTIN) 875-125 MG tablet    Sig: Take 1 tablet by mouth 2 (two) times daily.    Dispense:  20 tablet    Refill:  0     Follow up: 5  days  02/06/2019, MD

## 2019-10-09 ENCOUNTER — Ambulatory Visit (INDEPENDENT_AMBULATORY_CARE_PROVIDER_SITE_OTHER): Payer: Medicaid Other | Admitting: Obstetrics and Gynecology

## 2019-10-09 ENCOUNTER — Encounter: Payer: Self-pay | Admitting: Obstetrics and Gynecology

## 2019-10-09 ENCOUNTER — Other Ambulatory Visit: Payer: Self-pay

## 2019-10-09 VITALS — BP 117/78 | HR 87 | Ht 63.0 in | Wt 200.6 lb

## 2019-10-09 DIAGNOSIS — O8600 Infection of obstetric surgical wound, unspecified: Secondary | ICD-10-CM

## 2019-10-09 DIAGNOSIS — Z98891 History of uterine scar from previous surgery: Secondary | ICD-10-CM

## 2019-10-09 DIAGNOSIS — O8601 Infection of obstetric surgical wound, superficial incisional site: Secondary | ICD-10-CM

## 2019-10-09 DIAGNOSIS — O99345 Other mental disorders complicating the puerperium: Secondary | ICD-10-CM

## 2019-10-09 DIAGNOSIS — F419 Anxiety disorder, unspecified: Secondary | ICD-10-CM

## 2019-10-09 NOTE — Progress Notes (Signed)
Patient ID: Gabrielle Logan, female   DOB: 06/06/1986, 34 y.o.   MRN: [id number]     Subjective:  Gabrielle Logan is a 34 y.o. female now 3 weeks status post low transverse cesarean section. She reports erythema to incision but states that her skin has improved since beginning Augmentin five days ago. She also reports feeling very anxious.  Review of Systems Negative except erythema to incision.   Diet:  Normal   Bowel movements : normal.  Pain is maintained, but out of pain medication.  Objective:  BP 117/78 (BP Location: Right Arm, Patient Position: Sitting, Cuff Size: Normal)   Pulse 87   Ht 5\' 3"  (1.6 m)   Wt 200 lb 9.6 oz (91 kg)   BMI 35.53 kg/m  General:Well developed, well nourished. Anxious. Abdomen: Bowel sounds normal, soft, non-tender. Incision: Wide area of erythema that is approximately 10cm in diameter with an area of superficial skin breakdown just at the midline with purulent draininage without malodor.  Assessment:  Post-Op 3 weeks s/p low transverse cesarean section   1. Late post-operative wound infection 2. Anxiety attack   Plan:  1.Wound care discussed. Offered further opening of drianiage site. Patient refused this secondary to anxiety but agrees to local hot pack twice daily and to return in two days. 2. Continue current medications, will refill pain medication 3. Activity restrictions: No change 4. Return to work: N/A. 5. Follow up in 2 days.  By signing my name below, I, , attest that this documentation has been prepared under the direction and in the presence of Nikki Dom, MD. Electronically Signed: Tilda Burrow Medical Scribe. 10/09/19. 4:17 PM.  I personally performed the services described in this documentation, which was SCRIBED in my presence. The recorded information has been reviewed and considered accurate. It has been edited as necessary during review. 12/09/19, MD

## 2019-10-10 ENCOUNTER — Telehealth: Payer: Self-pay | Admitting: *Deleted

## 2019-10-10 ENCOUNTER — Other Ambulatory Visit: Payer: Self-pay | Admitting: Obstetrics and Gynecology

## 2019-10-10 MED ORDER — ACETAMINOPHEN-CODEINE #3 300-30 MG PO TABS: 2.0000 | ORAL_TABLET | ORAL | 0 refills | Status: DC | PRN

## 2019-10-10 MED ORDER — AMOXICILLIN-POT CLAVULANATE 875-125 MG PO TABS: 1.0000 | ORAL_TABLET | Freq: Two times a day (BID) | ORAL | 0 refills | Status: DC

## 2019-10-10 MED ORDER — IBUPROFEN 800 MG PO TABS: 800.0000 mg | ORAL_TABLET | Freq: Four times a day (QID) | ORAL | 0 refills | Status: DC

## 2019-10-10 NOTE — Progress Notes (Signed)
Tylenol #3 sent to [facility] as e-scribe Rx.

## 2019-10-10 NOTE — Progress Notes (Signed)
Refilled Augmentin, as well as prescription for ibuprofen and for Tylenol 3 sent to pharmacy

## 2019-10-10 NOTE — Telephone Encounter (Signed)
Opened in error

## 2019-10-11 ENCOUNTER — Encounter: Payer: Self-pay | Admitting: Obstetrics and Gynecology

## 2019-10-11 ENCOUNTER — Ambulatory Visit: Payer: Medicaid Other | Admitting: Obstetrics and Gynecology

## 2019-10-11 ENCOUNTER — Other Ambulatory Visit: Payer: Self-pay

## 2019-10-11 VITALS — BP 117/77 | HR 71 | Ht 63.0 in | Wt 202.0 lb

## 2019-10-11 DIAGNOSIS — Z98891 History of uterine scar from previous surgery: Secondary | ICD-10-CM

## 2019-10-11 DIAGNOSIS — O9089 Other complications of the puerperium, not elsewhere classified: Secondary | ICD-10-CM | POA: Insufficient documentation

## 2019-10-11 NOTE — Progress Notes (Signed)
Wound check: Gabrielle Logan is seen for wound seroma with erythema and edema.  This occurred approximately 3 weeks after cesarean section there is no malodor.  Today's drainage is clear serous drainage and there are 2 4 mm drainage sites 1 cm apart in the midline beneath a pannus that has peau d'orange and slight erythema without local heat The skin is cleansed with Betadine, and the small skin tissue bridge between the 2 draining sites is opened under local anesthesia 1% lidocaine x5 cc.  At this point the defect can be probed to a depth of approximately 6 cm, probably 5 cm wide and very thin proteinaceous debris is extracted.  Iodoform gauze was packed in the depths of the defect and ABD and 4 x 4 gauze applied over the surface, with sanitary napkin placed and taped in place.  She will follow up in 24 hours at the [facility] for dressing change and removal with iodoform gauze

## 2019-10-12 ENCOUNTER — Encounter (HOSPITAL_COMMUNITY): Payer: Self-pay | Admitting: Obstetrics and Gynecology

## 2019-10-12 ENCOUNTER — Inpatient Hospital Stay (HOSPITAL_COMMUNITY)
Admission: AD | Admit: 2019-10-12 | Discharge: 2019-10-12 | Disposition: A | Discharge status: Home / Self Care | Payer: Medicaid Other | Source: Ambulatory Visit | Attending: Obstetrics and Gynecology | Admitting: Obstetrics and Gynecology

## 2019-10-12 ENCOUNTER — Other Ambulatory Visit: Payer: Self-pay

## 2019-10-12 DIAGNOSIS — O902 Hematoma of obstetric wound: Secondary | ICD-10-CM | POA: Diagnosis present

## 2019-10-12 DIAGNOSIS — T8130XA Disruption of wound, unspecified, initial encounter: Secondary | ICD-10-CM

## 2019-10-12 MED ORDER — TRAMADOL HCL 50 MG PO TABS: 50.0000 mg | ORAL_TABLET | Freq: Four times a day (QID) | ORAL | 0 refills | Status: DC | PRN

## 2019-10-12 NOTE — MAU Provider Note (Signed)
History     CSN: [id number]  Arrival date and time: 10/12/19 0754   First Provider Initiated Contact with Patient 10/12/19 [phone]      Chief Complaint  Patient presents with  . Dressing change   34 y.o. G4P4 s/p CS about 3 weeks ago presenting for dressing change. She was seen in office yesterday and found to have seroma which was opened and packed with iodiform. She has been using Tylenol #3 for pain but it makes her sick.    OB History    Gravida  4   Para  4   Term  3   Preterm  1   AB      Living  3     SAB      TAB      Ectopic      Multiple  0   Live Births  4           Past Medical History:  Diagnosis Date  . ADHD   . Anxiety   . Depression   . OCD (obsessive compulsive disorder)   . PTSD (post-traumatic stress disorder)   . Thyroid disease    hyperthyroid    Past Surgical History:  Procedure Laterality Date  . CESAREAN SECTION  2003,2006,2011  . CESAREAN SECTION N/A 09/17/2019   Procedure: CESAREAN SECTION;  Surgeon: Tilda Burrow, MD;  Location: [facility];  Service: Obstetrics;  Laterality: N/A;    Family History  Problem Relation Age of Onset  . Thyroid disease Maternal Grandmother   . Other Father        heart issues  . Thyroid disease Mother   . Hypertension Mother   . Other Son        gastroschisis; wolf parkinson's white syndrome  . Down syndrome Son        passed away at age 47    Social History   Tobacco Use  . Smoking status: Never Smoker  . Smokeless tobacco: Current User    Types: Snuff  Substance Use Topics  . Alcohol use: Not Currently    Comment: rarely  . Drug use: Not Currently    Types: Marijuana    Allergies:  Allergies  Allergen Reactions  . Sulfa Antibiotics Anaphylaxis and Rash    Hard to breathe  . Hydrocodone Nausea And Vomiting and Other (See Comments)    Light-headedness  . Latex Rash    Mild skin irritation    Medications Prior to Admission  Medication Sig Dispense Refill Last Dose   . acetaminophen (TYLENOL) 325 MG tablet Take 2 tablets (650 mg total) by mouth every 6 (six) hours as needed for mild pain (temperature > 101.5.). 30 tablet 0   . acetaminophen-codeine (TYLENOL #3) 300-30 MG tablet Take 2 tablets by mouth every 4 (four) hours as needed for moderate pain. (Patient not taking: Reported on 10/11/2019) 30 tablet 0   . amoxicillin-clavulanate (AUGMENTIN) 875-125 MG tablet Take 1 tablet by mouth 2 (two) times daily. 20 tablet 0   . Blood Pressure Monitor MISC For regular home bp monitoring during pregnancy 1 each 0   . hydrOXYzine (ATARAX/VISTARIL) 50 MG tablet Take 1 tablet (50 mg total) by mouth 3 (three) times daily as needed for anxiety. 30 tablet 0   . ibuprofen (ADVIL) 800 MG tablet Take 1 tablet (800 mg total) by mouth every 6 (six) hours. 30 tablet 0   . norethindrone (ORTHO MICRONOR) 0.35 MG tablet Take 1 tablet (0.35 mg total) by  mouth daily. (Patient not taking: Reported on 09/24/2019) 1 Package 11   . PARoxetine (PAXIL) 20 MG tablet Take 1 tablet (20 mg total) by mouth daily. 30 tablet 3   . Prenatal Vit-Fe Fumarate-FA (PRENATAL MULTIVITAMIN) TABS tablet Take 1 tablet by mouth daily at 12 noon.       Review of Systems  Constitutional: Negative for chills and fever.  Skin: Positive for wound.   Physical Exam   Blood pressure 123/74, pulse 85, temperature 98.5 F (36.9 C), temperature source Oral, resp. rate 16, SpO2 99 %, currently breastfeeding.  Physical Exam  Nursing note and vitals reviewed. Constitutional: She is oriented to person, place, and time. She appears well-developed and well-nourished. No distress.  HENT:  Head: Normocephalic and atraumatic.  Respiratory: Effort normal. No respiratory distress.  Musculoskeletal:        General: Normal range of motion.     Cervical back: Normal range of motion.  Neurological: She is alert and oriented to person, place, and time.  Skin:  Low transverse incision with 2 cm opened area at center   Psychiatric: She has a normal mood and affect.   No results found for this or any previous visit (from the past 24 hour(s)).  MAU Course  Procedures  MDM Wound unpacked and packed by Dr. Glo Herring (see note).  Assessment and Plan   1. Wound dehiscence    Discharge home Follow up at [place] in 2 days Rx Tramadol  Allergies as of 10/12/2019      Reactions   Sulfa Antibiotics Anaphylaxis, Rash   Hard to breathe   Hydrocodone Nausea And Vomiting, Other (See Comments)   Light-headedness   Latex Rash   Mild skin irritation      Medication List    STOP taking these medications   acetaminophen-codeine 300-30 MG tablet Commonly known as: TYLENOL #3     TAKE these medications   acetaminophen 325 MG tablet Commonly known as: TYLENOL Take 2 tablets (650 mg total) by mouth every 6 (six) hours as needed for mild pain (temperature > 101.5.).   amoxicillin-clavulanate 875-125 MG tablet Commonly known as: Augmentin Take 1 tablet by mouth 2 (two) times daily.   Blood Pressure Monitor Misc For regular home bp monitoring during pregnancy   hydrOXYzine 50 MG tablet Commonly known as: ATARAX/VISTARIL Take 1 tablet (50 mg total) by mouth 3 (three) times daily as needed for anxiety.   ibuprofen 800 MG tablet Commonly known as: ADVIL Take 1 tablet (800 mg total) by mouth every 6 (six) hours.   norethindrone 0.35 MG tablet Commonly known as: Ortho Micronor Take 1 tablet (0.35 mg total) by mouth daily.   PARoxetine 20 MG tablet Commonly known as: PAXIL Take 1 tablet (20 mg total) by mouth daily.   prenatal multivitamin Tabs tablet Take 1 tablet by mouth daily at 12 noon.   traMADol 50 MG tablet Commonly known as: ULTRAM Take 1 tablet (50 mg total) by mouth every 6 (six) hours as needed.        Julianne Handler, CNM 10/12/2019, 9:22 AM

## 2019-10-12 NOTE — MAU Note (Signed)
  Dressing Change:  The patient reports no fever. She has noted some of the gauze on top has fallen out of dressing, and was pink.  Pt unable to tolerate Tylenol #3. Will switch to Tramadol.  Iodoform gauze extracted. Some opalescent fibrinous debris in bed of seroma noted, and extracted using kelly clamp. Base of seroma is clear. Defect is approximately 3 cm at skin, x 5 cm deep and 5 cm wide. Packed with dry-dry dressing, and topical 4 x 4's and ABD pad then sanitary napkin. Will continue po antibiotics. Recheck 2 days.  In office. 

## 2019-10-12 NOTE — Discharge Instructions (Signed)
Wound Dehiscence  Wound dehiscence is when a cut from surgery (an incision) opens up and does not heal like it should. This problem usually happens 7-10 days after surgery. You may have bleeding from the cut. You may also have pain or a fever. This condition should be treated early. Follow these instructions at home: Medicines  Take over-the-counter and prescription medicines only as told by your doctor.  If you were prescribed an antibiotic medicine, take it as told by your doctor. Do not stop taking it even if you start to feel better.  Use medicines that help to stop itching as told by your doctor. The wound may feel itchy as it heals. Wound care   Follow instructions from your doctor about how to take care of your wound. Make sure you: ? Wash your hands with soap and water before you change your bandage (dressing) or wash the wound area. If you cannot use soap and water, use hand sanitizer. ? Wash your wound with mild soap and water 2 times a day, or as told. Rinse off the soap. Pat the area dry with a clean towel. Do not rub the wound. ? Change bandages as told by your doctor.  Do not pick or scratch at the wound.  Check your wound area every day for signs of infection. Check for: ? More redness, swelling, or pain. ? More fluid or blood. ? Warmth. ? Pus or a bad smell. Activity  Avoid exercises that make you sweat or could stretch your wound.  Do not lift anything that is heavier than 10 lb (4.5 kg) until the wound is healed or until your doctor says that it is safe. General instructions  Do not take baths, swim, or use a hot tub until your doctor approves. You may take showers.  Keep all follow-up visits as told by your doctor. This is important. Contact a doctor if:  Your wound does not seem to be healing right.  You have a fever. Get help right away if:  You have more redness, swelling, or pain around your wound.  You have more fluid coming from your  wound.  Your wound feels warm to the touch.  You have pus or a bad smell coming from your wound.  More of the wound breaks open.  You have red streaks spreading from your wound.  You have a lot of bleeding from your wound. Summary  Wound dehiscence is when a cut from surgery (an incision) opens up and does not heal like it should.  Follow instructions from your doctor about how to take care of your wound.  Check your wound area every day for signs of infection. These signs can be redness, swelling, pain, fluid, blood, warmth, pus, or a bad smell.  If you were prescribed an antibiotic medicine, take it as told by your doctor. Do not stop taking it even if you start to feel better.  Contact a doctor if your wound does not seem to be healing right. This information is not intended to replace advice given to you by your health care provider. Make sure you discuss any questions you have with your health care provider. Document Revised: 06/30/2017 Document Reviewed: 06/22/2016 Elsevier Patient Education  2020 Elsevier Inc.  

## 2019-10-12 NOTE — MAU Note (Deleted)
  Dressing Change:  The patient reports no fever. She has noted some of the gauze on top has fallen out of dressing, and was pink.  Pt unable to tolerate Tylenol #3. Will switch to Tramadol.  Iodoform gauze extracted. Some opalescent fibrinous debris in bed of seroma noted, and extracted using kelly clamp. Base of seroma is clear. Defect is approximately 3 cm at skin, x 5 cm deep and 5 cm wide. Packed with dry-dry dressing, and topical 4 x 4's and ABD pad then sanitary napkin. Will continue po antibiotics. Recheck 2 days.  In office.

## 2019-10-12 NOTE — MAU Note (Signed)
Gabrielle Logan is a 34 y.o. here in MAU reporting: here for dressing change. S/p c/s 09/17/2019. Was in the office for dressing change yesterday and told to come here this morning. No pain. States the guaze Dr Emelda Fear put in her incision yesterday fell out while she was walking and noticed some pink drainage on it.   Pain score: 0/10  Vitals:   10/12/19 0807  BP: 123/74  Pulse: 85  Resp: 16  Temp: 98.5 F (36.9 C)  SpO2: 99%     Lab orders placed from triage: none

## 2019-10-13 ENCOUNTER — Telehealth: Payer: Self-pay | Admitting: Obstetrics and Gynecology

## 2019-10-13 NOTE — Telephone Encounter (Signed)
Patient called in followup of wound seroma care.  She was seen yesterday for wet-dry dressing change at MAU by me.  Pt advised to remove packing today, replace sanitary napkin over surface.Change Napkin daily 1-2 times/day. Has Appt Tuesday PM. Pt has my number for any concerns.

## 2019-10-14 ENCOUNTER — Encounter: Payer: Medicaid Other | Admitting: Obstetrics & Gynecology

## 2019-10-15 ENCOUNTER — Ambulatory Visit (INDEPENDENT_AMBULATORY_CARE_PROVIDER_SITE_OTHER): Payer: Medicaid Other | Admitting: Obstetrics and Gynecology

## 2019-10-15 ENCOUNTER — Other Ambulatory Visit: Payer: Self-pay

## 2019-10-15 ENCOUNTER — Encounter: Payer: Self-pay | Admitting: Obstetrics and Gynecology

## 2019-10-15 VITALS — BP 113/75 | HR 86 | Ht 63.0 in | Wt 199.4 lb

## 2019-10-15 DIAGNOSIS — O9089 Other complications of the puerperium, not elsewhere classified: Secondary | ICD-10-CM

## 2019-10-15 NOTE — Progress Notes (Addendum)
Patient ID: Gabrielle Logan, female   DOB: Sep 24, 1985, 34 y.o.   MRN: [id number]  Subjective: followup wound seroma with spontaneous drainage.  Gabrielle Logan is a 34 y.o. female now 4 weeks status post c section. She is not in a lot of pain right now. All of the pain medications she tried, including Tramadol, made her sick. She changes her dressing daily. Her husband helped remove the dressing packing Sunday She is going to begin her Micronor pills but plans to switch to Nexplanon in the future.   Review of Systems Negative fpr fever, chills,    Diet: negative   Bowel movements: normal. The patient is not in a lot of pain at this time.  Objective:  BP 113/75 (BP Location: Right Arm, Patient Position: Sitting, Cuff Size: Normal)   Pulse 86   Ht 5\' 3"  (1.6 m)   Wt 199 lb 6.4 oz (90.4 kg)   BMI 35.32 kg/m  General:Well developed, well nourished.  No acute distress. Abdomen: Bowel sounds normal, soft, non-tender. Pelvic Exam: DEFERRED  Incision(s): Some satellite lesions with ? yeast or fungus.  Assessment:  Post-Op 4 weeks s/p c section. Wound seroma, healing in steadily  Doing well postoperatively. Incision has some tiny red satellite lesions with ? yeast or moisture changes /fungus. Applied Gentian Violet to the incision. Plan:  1. Wound care discussed. Recommend changing dressings twice daily instead of once daily. Pt can still shower. Apply Neosporin to the incision. 2.   Current medications. Finish current antibiotics and start Micronor pills.  3.   Activity restrictions: no overhead lifting and no repetitive motion 4.   Return to work: not applicable. 5.   Follow up in 1 week.  By signing my name below, I, , attest that this documentation has been prepared under the direction and in the presence of Pietro Cassis, MD. Electronically Signed: Tilda Burrow, Medical Scribe. 10/15/19. 3:56 PM.  I personally performed the services described in this  documentation, which was SCRIBED in my presence. The recorded information has been reviewed and considered accurate. It has been edited as necessary during review. 10/17/19, MD

## 2019-10-22 ENCOUNTER — Ambulatory Visit: Payer: Medicaid Other | Admitting: Women's Health

## 2019-10-23 ENCOUNTER — Other Ambulatory Visit: Payer: Self-pay

## 2019-10-23 ENCOUNTER — Encounter: Payer: Self-pay | Admitting: Obstetrics and Gynecology

## 2019-10-23 ENCOUNTER — Ambulatory Visit (INDEPENDENT_AMBULATORY_CARE_PROVIDER_SITE_OTHER): Payer: Medicaid Other | Admitting: Obstetrics and Gynecology

## 2019-10-23 VITALS — BP 94/70 | HR 85 | Ht 63.0 in | Wt 200.0 lb

## 2019-10-23 DIAGNOSIS — L929 Granulomatous disorder of the skin and subcutaneous tissue, unspecified: Secondary | ICD-10-CM

## 2019-10-23 DIAGNOSIS — Z9889 Other specified postprocedural states: Secondary | ICD-10-CM

## 2019-10-23 DIAGNOSIS — Z98891 History of uterine scar from previous surgery: Secondary | ICD-10-CM

## 2019-10-23 NOTE — Progress Notes (Signed)
Patient ID: [name], female   DOB: 06-10-1986, 34 y.o.   MRN: [id number]     Subjective:  Nolene HARMONII KARLE is a 34 y.o. female now 5 weeks status post low transverse cesarean section. The patient states that the drainage has improved. She is changing her dressing daily.  Review of Systems Negative except drainage that has improved   Diet:   Normal   Bowel movements : normal.  The patient is not having any pain.  Objective:  There were no vitals taken for this visit. General:Well developed, well nourished. Anxious. Abdomen: Bowel sounds normal, soft, non-tender Pelvic exam: Deferred     Incision(s): 2cm wide by 42mm deep asymmetric with granulation tissue bulging through the incision. Treated with silver nitrate and debrided. Dressing reapplied.   Assessment:  Post-Op 5 weeks s/p low transverse cesarean section   Dong well postoperatively.   Plan:  1.Wound care discussed. Recommended changing the dressing twice daily 2. Current medications. She is taking Micronor pills 3. Activity restrictions: no overhead lifting and no repetitive motion 4. return to work: not applicable. 5. Follow up in 1 week.  By signing my name below, I, Nikki Dom, attest that this documentation has been prepared under the direction and in the presence of Tilda Burrow, MD. Electronically Signed: Nikki Dom Medical Scribe. 10/23/19. 3:25 PM.  I personally performed the services described in this documentation, which was SCRIBED in my presence. The recorded information has been reviewed and considered accurate. It has been edited as necessary during review. Tilda Burrow, MD

## 2019-10-31 ENCOUNTER — Encounter: Payer: Self-pay | Admitting: Obstetrics and Gynecology

## 2019-10-31 ENCOUNTER — Ambulatory Visit (INDEPENDENT_AMBULATORY_CARE_PROVIDER_SITE_OTHER): Payer: Medicaid Other | Admitting: Obstetrics and Gynecology

## 2019-10-31 ENCOUNTER — Other Ambulatory Visit: Payer: Self-pay

## 2019-10-31 VITALS — BP 103/67 | HR 87 | Ht 63.0 in | Wt 199.4 lb

## 2019-10-31 DIAGNOSIS — O9089 Other complications of the puerperium, not elsewhere classified: Secondary | ICD-10-CM

## 2019-10-31 NOTE — Progress Notes (Signed)
Patient ID: [name], female   DOB: 1986-05-15, 34 y.o.   MRN: [id number]     Subjective:  Azaela MARQUELLE BALOW is a 34 y.o. female now 6 weeks status post low transverse cesarean section. Complicated by wound seroma, withslow healing and a dimple retraction in midline. This is drying steadily   no fever no drainage some? Of yeast.   Review of Systems Negative   Diet:   normal   Bowel movements : normal.  The patient is not having any pain.  Objective:  BP 103/67 (BP Location: Left Arm, Patient Position: Sitting, Cuff Size: Normal)   Pulse 87   Ht 5\' 3"  (1.6 m)   Wt 199 lb 6.4 oz (90.4 kg)   BMI 35.32 kg/m  General:Well developed, well nourished.  No acute distress. Abdomen: Bowel sounds normal, soft, non-tender.  Incision(s): Healing well, no drainage, no erythema, no hernia, no swelling, no dehiscence. There is a slight crevice forming at the incision.      Assessment:  Post-Op 6 weeks s/p low transverse cesarean section   Wound seroma postoperatively. Now mostly healed   Plan:  1.Wound care discussed   2. . current medications.neosporin 3. Activity restrictions: no lifting more than 15 pounds x 2 wk pounds 4. return to work: 2-3 weeks. 5. Follow up in 3 weeks.   By signing my name below, I, , attest that this documentation has been prepared under the direction and in the presence of [employer], MD. Electronically Signed: Tilda Burrow Medical Scribe. 10/31/19. 3:05 PM.  I personally performed the services described in this documentation, which was SCRIBED in my presence. The recorded information has been reviewed and considered accurate. It has been edited as necessary during review. 12/31/19, MD

## 2019-11-21 ENCOUNTER — Other Ambulatory Visit: Payer: Self-pay

## 2019-11-21 ENCOUNTER — Encounter: Payer: Self-pay | Admitting: Obstetrics and Gynecology

## 2019-11-21 ENCOUNTER — Ambulatory Visit (INDEPENDENT_AMBULATORY_CARE_PROVIDER_SITE_OTHER): Payer: Medicaid Other | Admitting: Obstetrics and Gynecology

## 2019-11-21 VITALS — BP 110/70 | HR 69 | Ht 63.0 in | Wt 203.6 lb

## 2019-11-21 DIAGNOSIS — O9089 Other complications of the puerperium, not elsewhere classified: Secondary | ICD-10-CM

## 2019-11-21 DIAGNOSIS — Z98891 History of uterine scar from previous surgery: Secondary | ICD-10-CM

## 2019-11-21 NOTE — Progress Notes (Signed)
PATIENT ID: Gabrielle Logan, female     DOB: 12-14-1985, 34 y.o.     MRN: [id number]  Subjective:  Gabrielle Logan is a 34 y.o. female now 9 weeks status post low transverse cesarean section.   She is interested in swapping from birth control pills to nexplanon.  Will do at first menses, scheduled for 3 wk from now  Review of Systems Negative   Diet:   normal   Bowel movements : normal.  The patient is not having any pain.  Objective:  BP 110/70 (BP Location: Right Arm, Patient Position: Sitting, Cuff Size: Normal)    Pulse 69    Ht 5\' 3"  (1.6 m)    Wt 203 lb 9.6 oz (92.4 kg)    Breastfeeding Yes    BMI 36.07 kg/m  General:Well developed, well nourished.  No acute distress. Abdomen: Bowel sounds normal, soft, non-tender.  Incision(s): Healing well, no drainage, no erythema, no hernia, no swelling, no dehiscence   Assessment:  Post-Op 9 weeks s/p low transverse cesarean section   Birth control options discussed: Reviewed all forms of birth control options available including abstinence; over the counter/barrier methods; depo, nuva ring, Nexplanon, IUD, OCP and patch.   Risks/benefits/side effects of each discussed. Questions were answered.  Pt chooses nexplanon.     Plan:  1.Wound care discussed  Healed, with deep indentation 2. . current medications.unchanged 3. Activity restrictions: none 4. return to work: not applicable. 5. Follow up in  3 wk nexplanon insert   By signing my name below, I, , attest that this documentation has been prepared under the direction and in the presence of [employer], MD. Electronically Signed: Tilda Burrow Medical Scribe. 11/21/19. 3:24 PM.  I personally performed the services described in this documentation, which was SCRIBED in my presence. The recorded information has been reviewed and considered accurate. It has been edited as necessary during review. 11/23/19, MD

## 2019-12-12 ENCOUNTER — Encounter: Payer: Medicaid Other | Admitting: Advanced Practice Midwife

## 2020-01-13 ENCOUNTER — Encounter: Payer: Medicaid Other | Admitting: Advanced Practice Midwife

## 2020-02-17 ENCOUNTER — Other Ambulatory Visit: Payer: Self-pay | Admitting: *Deleted

## 2020-02-18 ENCOUNTER — Telehealth: Payer: Self-pay | Admitting: Obstetrics and Gynecology

## 2020-02-18 MED ORDER — NORETHIN ACE-ETH ESTRAD-FE 1-20 MG-MCG(24) PO TABS: 1.0000 | ORAL_TABLET | Freq: Every day | ORAL | 6 refills | Status: DC

## 2020-02-18 NOTE — Telephone Encounter (Signed)
Patient called in saying that she was suppose to get a prescription from the pharmacy for birth control, but when she went they told her that they did not receive anything, and she would like a call back about the status of getting a new prescription.

## 2020-02-18 NOTE — Telephone Encounter (Signed)
Returned patient's call.  States she has just received a notification that the [facility] was sent to her pharmacy and has been filled.  Advised to give [place] a call if she needs anything else.  Verbalized understanding.

## 2020-06-12 ENCOUNTER — Other Ambulatory Visit: Payer: Self-pay | Admitting: *Deleted

## 2020-06-15 MED ORDER — NORETHIN ACE-ETH ESTRAD-FE 1-20 MG-MCG(24) PO TABS: 1.0000 | ORAL_TABLET | Freq: Every day | ORAL | 6 refills | Status: DC

## 2021-02-25 ENCOUNTER — Telehealth: Payer: Self-pay | Admitting: Advanced Practice Midwife

## 2021-02-25 MED ORDER — NORETHIN ACE-ETH ESTRAD-FE 1-20 MG-MCG(24) PO TABS: 1.0000 | ORAL_TABLET | Freq: Every day | ORAL | 1 refills | Status: DC

## 2021-02-25 NOTE — Telephone Encounter (Signed)
Pt needs refill on BC until her pap/physical on 04/08/2021  Please advise & notify pt   [facility]

## 2021-02-25 NOTE — Telephone Encounter (Signed)
Gabrielle Logan denies smoking or breastfeeding , will refill loestrin 24 Fe till appt.

## 2021-02-25 NOTE — Addendum Note (Signed)
Addended by: Cyril Mourning A on: 02/25/2021 02:44 PM   Modules accepted: Orders

## 2021-02-25 NOTE — Telephone Encounter (Signed)
Left message to verify which birth control she is wanting refilled, Loestrin 24 fe or Micronor

## 2021-04-07 ENCOUNTER — Other Ambulatory Visit: Payer: Self-pay

## 2021-04-07 ENCOUNTER — Telehealth: Payer: Self-pay | Admitting: Advanced Practice Midwife

## 2021-04-07 MED ORDER — NORETHIN ACE-ETH ESTRAD-FE 1-20 MG-MCG(24) PO TABS: 1.0000 | ORAL_TABLET | Freq: Every day | ORAL | 1 refills | Status: DC

## 2021-04-07 NOTE — Telephone Encounter (Signed)
Pt had to reschedule her PAP appt due to car trouble Can we refill her BC until she can be seen?  New appt is 05/07/21  Please advise & notify pt      [facility] [address]

## 2021-04-08 ENCOUNTER — Other Ambulatory Visit: Payer: Medicaid Other | Admitting: Advanced Practice Midwife

## 2021-05-06 ENCOUNTER — Encounter: Payer: Self-pay | Admitting: Advanced Practice Midwife

## 2021-05-06 ENCOUNTER — Other Ambulatory Visit (HOSPITAL_COMMUNITY)
Admission: RE | Admit: 2021-05-06 | Discharge: 2021-05-06 | Disposition: A | Discharge status: Home / Self Care | Payer: Medicaid Other | Source: Ambulatory Visit | Attending: Advanced Practice Midwife | Admitting: Advanced Practice Midwife

## 2021-05-06 ENCOUNTER — Other Ambulatory Visit: Payer: Self-pay

## 2021-05-06 ENCOUNTER — Ambulatory Visit (INDEPENDENT_AMBULATORY_CARE_PROVIDER_SITE_OTHER): Payer: Medicaid Other | Admitting: Advanced Practice Midwife

## 2021-05-06 VITALS — BP 120/77 | HR 74 | Ht 63.0 in | Wt 178.0 lb

## 2021-05-06 DIAGNOSIS — F419 Anxiety disorder, unspecified: Secondary | ICD-10-CM | POA: Diagnosis not present

## 2021-05-06 DIAGNOSIS — F32A Depression, unspecified: Secondary | ICD-10-CM

## 2021-05-06 DIAGNOSIS — Z01419 Encounter for gynecological examination (general) (routine) without abnormal findings: Secondary | ICD-10-CM

## 2021-05-06 DIAGNOSIS — Z309 Encounter for contraceptive management, unspecified: Secondary | ICD-10-CM | POA: Diagnosis not present

## 2021-05-06 LAB — POCT URINE PREGNANCY: Preg Test, Ur: NEGATIVE

## 2021-05-06 MED ORDER — NORETHIN ACE-ETH ESTRAD-FE 1-20 MG-MCG(24) PO TABS: 1.0000 | ORAL_TABLET | Freq: Every day | ORAL | 4 refills | Status: DC

## 2021-05-06 NOTE — Progress Notes (Signed)
Gabrielle Logan 35 y.o.  Vitals:   05/06/21 1342  BP: 120/77  Pulse: 74     Filed Weights   05/06/21 1342  Weight: 178 lb (80.7 kg)    Past Medical History: Past Medical History:  Diagnosis Date   ADHD    Anxiety    Depression    OCD (obsessive compulsive disorder)    PTSD (post-traumatic stress disorder)    Thyroid disease    hyperthyroid    Past Surgical History: Past Surgical History:  Procedure Laterality Date   CESAREAN SECTION  2003,2006,2011   CESAREAN SECTION N/A 09/17/2019   Procedure: CESAREAN SECTION;  Surgeon: Tilda Burrow, MD;  Location: [facility] LD ORS;  Service: Obstetrics;  Laterality: N/A;    Family History: Family History  Problem Relation Age of Onset   Thyroid disease Maternal Grandmother    Other Father        heart issues   Thyroid disease Mother    Hypertension Mother    Other Son        gastroschisis; wolf parkinson's white syndrome   Down syndrome Son        passed away at age 78    Social History: Social History   Tobacco Use   Smoking status: Never   Smokeless tobacco: Current    Types: Snuff  Vaping Use   Vaping Use: Never used  Substance Use Topics   Alcohol use: Not Currently    Comment: rarely   Drug use: Not Currently    Types: Marijuana    Allergies:  Allergies  Allergen Reactions   Sulfa Antibiotics Anaphylaxis and Rash    Hard to breathe   Hydrocodone Nausea And Vomiting and Other (See Comments)    Light-headedness   Latex Rash    Mild skin irritation      Current Outpatient Medications:    Norethindrone Acetate-Ethinyl Estrad-FE (LOESTRIN 24 FE) 1-20 MG-MCG(24) tablet, Take 1 tablet by mouth daily., Disp: 28 tablet, Rfl: 1  History of Present Illness: Here for pap and physical  Last pap 6/19, normal.  On COCs for contraception, happy with them.  Getting married in a few weeks Tresa Endo Foster's son).    Review of Systems   Patient denies any headaches, blurred vision, shortness of breath, chest pain,  abdominal pain, problems with bowel movements, urination, or intercourse.   Physical Exam: General:  Well developed, well nourished, no acute distress Skin:  Warm and dry Neck:  Midline trachea, normal thyroid Lungs; Clear to auscultation bilaterally Breast:  No dominant palpable mass, retraction, or nipple discharge Cardiovascular: Regular rate and rhythm Abdomen:  Soft, non tender, no hepatosplenomegaly Pelvic:  External genitalia is normal in appearance.  The vagina is normal in appearance.  The cervix is bulbous.  Uterus is felt to be normal size, shape, and contour.  No adnexal masses or tenderness noted.  Extremities:  No swelling or varicosities noted Psych:  No mood changes.     Impression: Normal GYN exam     Plan: If pap normal repeat 3 years Mammogram age 63 Referral to [facility] for therapy/possible psyche referral

## 2021-05-06 NOTE — Addendum Note (Signed)
Addended by: Leilani Able, Amna Welker A on: 05/06/2021 03:27 PM   Modules accepted: Orders

## 2021-05-10 LAB — CYTOLOGY - PAP
Comment: NEGATIVE
Diagnosis: NEGATIVE
High risk HPV: NEGATIVE

## 2021-05-27 NOTE — BH Specialist Note (Signed)
Integrated Behavioral Health via Telemedicine Visit  05/27/2021 [name] [id number]  Number of Integrated Behavioral Health visits: 1 Session Start time: 2:56  Session End time: 3:39 Total time:  47  Referring Provider: Jacklyn Shell, CNM Patient/Family location: Home [facility] Provider location: [facility]  All persons participating in visit: Patient Gabrielle Logan and [facility] Santana Edell   Types of Service: Individual psychotherapy and Telephone visit  I connected with Fareeha N Gras and/or Cornelious N Dotts's  n/a  via  Telephone or Video Enabled Telemedicine Application  (Video is Caregility application) and verified that I am speaking with the correct person using two identifiers. Discussed confidentiality: Yes   I discussed the limitations of telemedicine and the availability of in person appointments.  Discussed there is a possibility of technology failure and discussed alternative modes of communication if that failure occurs.  I discussed that engaging in this telemedicine visit, they consent to the provision of behavioral healthcare and the services will be billed under their insurance.  Patient and/or legal guardian expressed understanding and consented to Telemedicine visit: Yes   Presenting Concerns: Patient and/or family reports the following symptoms/concerns: Anxiety and low energy; history of PTSD from childhood trauma and loss of son. Pt requests medication to help manage anxiety and open to implementing self-coping strategy.  Duration of problem: Ongoing; Severity of problem: moderate  Patient and/or Family's Strengths/Protective Factors: Social connections, Concrete supports in place (healthy food, safe environments, etc.), Sense of purpose, and Physical Health (exercise, healthy diet, medication compliance, etc.)  Goals Addressed: Patient will:  Reduce symptoms of: anxiety and  stress   Increase knowledge and/or ability of: coping skills    Progress towards Goals: Ongoing  Interventions: Interventions utilized:  Mindfulness or Management consultant and Psychoeducation and/or Health Education Standardized Assessments completed: Not Needed  Patient and/or Family Response: Pt agrees to treatment plan  Assessment: Patient currently experiencing PTSD, as previously diagnosed.   Patient may benefit from psychoeducation and brief therapeutic interventions regarding coping with symptoms of anxiety related to PTSD and current life stress .  Plan: Follow up with behavioral health clinician on : Two weeks; Call Lilyanna Lunt at [phone], as needed. Behavioral recommendations:  -CALM relaxation breathing exercise twice daily (morning; at bedtime with sleep sounds); as needed throughout the day -Continue sleeping on side nightly to maintain no sleep paralysis Referral(s): Integrated [employer] (In Clinic)  I discussed the assessment and treatment plan with the patient and/or parent/guardian. They were provided an opportunity to ask questions and all were answered. They agreed with the plan and demonstrated an understanding of the instructions.   They were advised to call back or seek an in-person evaluation if the symptoms worsen or if the condition fails to improve as anticipated.  Rae Lips, LCSW  Depression screen [facility] 2/9 05/06/2021 03/12/2019 01/16/2018 01/06/2017  Decreased Interest 0 0 0 0  Down, Depressed, Hopeless 0 0 1 0  PHQ - 2 Score 0 0 1 0  Altered sleeping 0 1 - -  Tired, decreased energy 1 2 - -  Change in appetite 0 0 - -  Feeling bad or failure about yourself  0 0 - -  Trouble concentrating 1 0 - -  Moving slowly or fidgety/restless 0 0 - -  Suicidal thoughts 0 0 - -  PHQ-9 Score 2 3 - -   GAD 7 : Generalized Anxiety Score 05/06/2021  Nervous, Anxious, on Edge 1  Control/stop  worrying 0  Worry too much - different things 1   Trouble relaxing 1  Restless 0  Easily annoyed or irritable 0  Afraid - awful might happen 0  Total GAD 7 Score 3

## 2021-06-10 ENCOUNTER — Encounter: Payer: Self-pay | Admitting: Advanced Practice Midwife

## 2021-06-10 ENCOUNTER — Other Ambulatory Visit: Payer: Self-pay | Admitting: Advanced Practice Midwife

## 2021-06-10 ENCOUNTER — Ambulatory Visit (INDEPENDENT_AMBULATORY_CARE_PROVIDER_SITE_OTHER): Payer: Medicaid Other | Admitting: Clinical

## 2021-06-10 DIAGNOSIS — F419 Anxiety disorder, unspecified: Secondary | ICD-10-CM | POA: Insufficient documentation

## 2021-06-10 DIAGNOSIS — Z8659 Personal history of other mental and behavioral disorders: Secondary | ICD-10-CM

## 2021-06-10 MED ORDER — HYDROXYZINE PAMOATE 25 MG PO CAPS: 25.0000 mg | ORAL_CAPSULE | Freq: Three times a day (TID) | ORAL | 3 refills | Status: DC | PRN

## 2021-06-10 NOTE — Progress Notes (Signed)
Vistaril ordered per recommendation of [facility]

## 2021-06-10 NOTE — Patient Instructions (Signed)
[facility] [address] [place], [place] [address] [phone] (main office) [phone] (Kjell Brannen's office)  /Emotional Wellbeing Apps and Websites Here are a few free apps meant to help you to help yourself.  To find, try searching on the internet to see if the app is offered on Apple/Android devices. If your first choice doesn't come up on your device, the good news is that there are many choices! Play around with different apps to see which ones are helpful to you.    Calm This is an app meant to help increase calm feelings. Includes info, strategies, and tools for tracking your feelings.      Calm Harm  This app is meant to help with self-harm. Provides many 5-minute or 15-min coping strategies for doing instead of hurting yourself.       Healthy Minds Health Minds is a problem-solving tool to help deal with emotions and cope with stress you encounter wherever you are.      MindShift This app can help people cope with anxiety. Rather than trying to avoid anxiety, you can make an important shift and face it.      MY3  MY3 features a support system, safety plan and resources with the goal of offering a tool to use in a time of need.       My Life My Voice  This mood journal offers a simple solution for tracking your thoughts, feelings and moods. Animated emoticons can help identify your mood.       Relax Melodies Designed to help with sleep, on this app you can mix sounds and meditations for relaxation.      Smiling Mind Smiling Mind is meditation made easy: it's a simple tool that helps put a smile on your mind.        Stop, Breathe & Think  A friendly, simple guide for people through meditations for mindfulness and compassion.  Stop, Breathe and Think Kids Enter your current feelings and choose a "mission" to help you cope. Offers videos for certain moods instead of just sound recordings.       Team  Orange The goal of this tool is to help teens change how they think, act, and react. This app helps you focus on your own good feelings and experiences.      The Virtual Hope Box The Virtual Hope Box (VHB) contains simple tools to help patients with coping, relaxation, distraction, and positive thinking.     

## 2021-06-14 NOTE — BH Specialist Note (Deleted)
Integrated Behavioral Health via Telemedicine Visit  06/14/2021 [name] [id number]  Number of Integrated Behavioral Health visits: *** Session Start time: 2:15***  Session End time: 2:45*** Total time: {IBH Total Time:21014050}  Referring Provider: *** Patient/Family location: Home*** [facility] Provider location: [facility]  All persons participating in visit: Patient *** and [facility] Leman Martinek ***  Types of Service: {CHL AMB TYPE OF SERVICE:6032952848}  I connected with Debbra N Laningham and/or Gretta N Kozicki's {family members:20773} via  Telephone or Engineer, civil (consulting)  (Video is Caregility application) and verified that I am speaking with the correct person using two identifiers. Discussed confidentiality: {YES/NO:21197}  I discussed the limitations of telemedicine and the availability of in person appointments.  Discussed there is a possibility of technology failure and discussed alternative modes of communication if that failure occurs.  I discussed that engaging in this telemedicine visit, they consent to the provision of behavioral healthcare and the services will be billed under their insurance.  Patient and/or legal guardian expressed understanding and consented to Telemedicine visit: {YES/NO:21197}  Presenting Concerns: Patient and/or family reports the following symptoms/concerns: *** Duration of problem: ***; Severity of problem: {Mild/Moderate/Severe:20260}  Patient and/or Family's Strengths/Protective Factors: {CHL AMB BH PROTECTIVE FACTORS:[phone]}  Goals Addressed: Patient will:  Reduce symptoms of: {IBH Symptoms:21014056}   Increase knowledge and/or ability of: {IBH Patient Tools:21014057}   Demonstrate ability to: {IBH Goals:21014053}  Progress towards Goals: {CHL AMB BH PROGRESS TOWARDS [phone]}  Interventions: Interventions utilized:  {IBH  Interventions:21014054} Standardized Assessments completed: {IBH Screening Tools:21014051}  Patient and/or Family Response: ***  Assessment: Patient currently experiencing ***.   Patient may benefit from ***.  Plan: Follow up with behavioral health clinician on : *** Behavioral recommendations: *** Referral(s): {IBH Referrals:21014055}  I discussed the assessment and treatment plan with the patient and/or parent/guardian. They were provided an opportunity to ask questions and all were answered. They agreed with the plan and demonstrated an understanding of the instructions.   They were advised to call back or seek an in-person evaluation if the symptoms worsen or if the condition fails to improve as anticipated.  Valetta Close Herron Fero, LCSW

## 2022-07-04 ENCOUNTER — Other Ambulatory Visit: Payer: Self-pay | Admitting: Advanced Practice Midwife

## 2022-07-06 ENCOUNTER — Other Ambulatory Visit: Payer: Self-pay | Admitting: Adult Health

## 2022-07-06 ENCOUNTER — Telehealth: Payer: Self-pay | Admitting: Advanced Practice Midwife

## 2022-07-06 MED ORDER — NORETHIN ACE-ETH ESTRAD-FE 1-20 MG-MCG(24) PO TABS: 1.0000 | ORAL_TABLET | Freq: Every day | ORAL | 1 refills | Status: DC

## 2022-07-06 NOTE — Telephone Encounter (Signed)
Patient needs refill on her birth control medicines. Please advise.

## 2022-07-06 NOTE — Progress Notes (Signed)
Refilled Loestrin 24 FE

## 2022-07-13 ENCOUNTER — Ambulatory Visit: Payer: Medicaid Other | Admitting: Adult Health

## 2022-08-24 ENCOUNTER — Encounter: Payer: Self-pay | Admitting: Adult Health

## 2022-08-24 ENCOUNTER — Ambulatory Visit (INDEPENDENT_AMBULATORY_CARE_PROVIDER_SITE_OTHER): Payer: Medicaid Other | Admitting: Adult Health

## 2022-08-24 VITALS — BP 110/70 | HR 74 | Ht 63.0 in | Wt 186.5 lb

## 2022-08-24 DIAGNOSIS — Z01419 Encounter for gynecological examination (general) (routine) without abnormal findings: Secondary | ICD-10-CM

## 2022-08-24 DIAGNOSIS — Z3041 Encounter for surveillance of contraceptive pills: Secondary | ICD-10-CM

## 2022-08-24 DIAGNOSIS — K439 Ventral hernia without obstruction or gangrene: Secondary | ICD-10-CM | POA: Insufficient documentation

## 2022-08-24 MED ORDER — JUNEL FE 24 1-20 MG-MCG(24) PO TABS: 1.0000 | ORAL_TABLET | Freq: Every day | ORAL | 4 refills | Status: DC

## 2022-08-24 NOTE — Progress Notes (Signed)
Patient ID: Gabrielle Logan, female   DOB: [birth year], 37 y.o.   MRN: [id number] History of Present Illness: Gabrielle Logan is a 37 year old white female,married, [id number] in for a well woman gyn exam.  Last pap was negative HPV,NILM 05/06/21.   Current Medications, Allergies, Past Medical History, Past Surgical History, Family History and Social History were reviewed in [employer] record.     Review of Systems: Patient denies any headaches, hearing loss, fatigue, blurred vision, shortness of breath, chest pain, abdominal pain, problems with bowel movements, urination, or intercourse. No joint pain or mood swings.     Physical Exam:BP 110/70 (BP Location: Left Arm, Patient Position: Sitting, Cuff Size: Normal)   Pulse 74   Ht 5\' 3"  (1.6 m)   Wt 186 lb 8 oz (84.6 kg)   BMI 33.04 kg/m   General:  Well developed, well nourished, no acute distress Skin:  Warm and dry Neck:  Midline trachea, normal thyroid, good ROM, no lymphadenopathy Lungs; Clear to auscultation bilaterally Breast:  No dominant palpable mass, retraction, or nipple discharge Cardiovascular: Regular rate and rhythm Abdomen:  Soft, non tender, no hepatosplenomegaly, has rectus muscle separation, with ventral hernia, no pain Pelvic:  External genitalia is normal in appearance, no lesions.  The vagina is normal in appearance. Urethra has no lesions or masses. The cervix is bulbous.  Uterus is felt to be normal size, shape, and contour.  No adnexal masses or tenderness noted.Bladder is non tender, no masses felt. Extremities/musculoskeletal:  No swelling or varicosities noted, no clubbing or cyanosis Psych:  No mood changes, alert and cooperative,seems happy AA is 0 Fall risk is low    08/24/2022    3:38 PM 05/06/2021    1:46 PM 03/12/2019    2:23 PM  Depression screen PHQ 2/9  Decreased Interest 0 0 0  Down, Depressed, Hopeless 0 0 0  PHQ - 2 Score 0 0 0  Altered sleeping 0 0 1  Tired, decreased  energy 0 1 2  Change in appetite 0 0 0  Feeling bad or failure about yourself  0 0 0  Trouble concentrating 0 1 0  Moving slowly or fidgety/restless 0 0 0  Suicidal thoughts 0 0 0  PHQ-9 Score 0 2 3       08/24/2022    3:38 PM 05/06/2021    1:47 PM  GAD 7 : Generalized Anxiety Score  Nervous, Anxious, on Edge 0 1  Control/stop worrying 0 0  Worry too much - different things 0 1  Trouble relaxing 0 1  Restless 0 0  Easily annoyed or irritable 0 0  Afraid - awful might happen 0 0  Total GAD 7 Score 0 3      [facility] - 08/24/22 1537       Pregnancy Intention Screening   Does the patient want to become pregnant in the next year? No    Does the patient's partner want to become pregnant in the next year? No    Would the patient like to discuss contraceptive options today? No      Contraception Wrap Up   Current Method Oral Contraceptive    End Method Oral Contraceptive             Examination chaperoned by Levy Pupa LPN  Impression and Plan: 1. Encounter for well woman exam with routine gynecological exam Pap and physical in 1 year  2. Encounter for surveillance of contraceptive pills Happy with Junel 1/20 Refilled  Junel 1/20 Meds ordered this encounter  Medications   Norethindrone Acetate-Ethinyl Estrad-FE (JUNEL FE 24) 1-20 MG-MCG(24) tablet    Sig: Take 1 tablet by mouth daily.    Dispense:  84 tablet    Refill:  4    Order Specific Question:   Supervising Provider    Answer:   Elonda Husky, LUTHER H [[id number]]     3. Ventral hernia without obstruction or gangrene If pain go to ER  She declines surgical consult at this time

## 2022-11-26 ENCOUNTER — Other Ambulatory Visit: Payer: Self-pay | Admitting: Adult Health

## 2023-08-24 ENCOUNTER — Telehealth: Payer: Self-pay

## 2023-08-24 MED ORDER — HAILEY 24 FE 1-20 MG-MCG(24) PO TABS: 1.0000 | ORAL_TABLET | Freq: Every day | ORAL | 4 refills | Status: DC

## 2023-08-24 NOTE — Telephone Encounter (Signed)
Refilled Gabrielle Logan, has appt

## 2023-08-24 NOTE — Telephone Encounter (Signed)
Pt needs a refill on her birth control. She is going to run out before her appt here 2/19. Thanks! JSY

## 2023-08-24 NOTE — Telephone Encounter (Signed)
Patient called and stated that she needs her birth control refilled before her appointment on 2/19//2025.

## 2023-09-20 ENCOUNTER — Ambulatory Visit: Payer: Medicaid Other | Admitting: Advanced Practice Midwife

## 2023-11-01 ENCOUNTER — Ambulatory Visit: Payer: Medicaid Other | Admitting: Adult Health

## 2023-12-07 ENCOUNTER — Ambulatory Visit (INDEPENDENT_AMBULATORY_CARE_PROVIDER_SITE_OTHER): Admitting: Adult Health

## 2023-12-07 ENCOUNTER — Other Ambulatory Visit (HOSPITAL_COMMUNITY)
Admission: RE | Admit: 2023-12-07 | Discharge: 2023-12-07 | Disposition: A | Discharge status: Home / Self Care | Source: Ambulatory Visit | Attending: Adult Health | Admitting: Adult Health

## 2023-12-07 ENCOUNTER — Encounter: Payer: Self-pay | Admitting: Adult Health

## 2023-12-07 VITALS — BP 106/70 | HR 82 | Ht 63.75 in | Wt 195.0 lb

## 2023-12-07 DIAGNOSIS — Z1331 Encounter for screening for depression: Secondary | ICD-10-CM | POA: Diagnosis not present

## 2023-12-07 DIAGNOSIS — Z13220 Encounter for screening for lipoid disorders: Secondary | ICD-10-CM | POA: Diagnosis not present

## 2023-12-07 DIAGNOSIS — Z0000 Encounter for general adult medical examination without abnormal findings: Secondary | ICD-10-CM | POA: Insufficient documentation

## 2023-12-07 DIAGNOSIS — Z3041 Encounter for surveillance of contraceptive pills: Secondary | ICD-10-CM | POA: Diagnosis not present

## 2023-12-07 MED ORDER — HAILEY 24 FE 1-20 MG-MCG(24) PO TABS: 1.0000 | ORAL_TABLET | Freq: Every day | ORAL | 4 refills | Status: DC

## 2023-12-07 NOTE — Progress Notes (Signed)
 Patient ID: Gabrielle Logan, female   DOB: July 22, 1986, 38 y.o.   MRN: [id number] History of Present Illness: Gabrielle Logan is a 38 year old white female,married, [id number] in for a well woman gyn exam and pap.     Current Medications, Allergies, Past Medical History, Past Surgical History, Family History and Social History were reviewed in [name].     Review of Systems: Patient denies any headaches, hearing loss, fatigue, blurred vision, shortness of breath, chest pain, abdominal pain, problems with bowel movements, urination, or intercourse. No joint pain or mood swings.  She is happy with her BCP   Physical Exam:BP 106/70 (BP Location: Left Arm, Patient Position: Sitting, Cuff Size: Normal)   Pulse 82   Ht 5' 3.75" (1.619 m)   Wt 195 lb (88.5 kg)   BMI 33.73 kg/m   General:  Well developed, well nourished, no acute distress Skin:  Warm and dry Neck:  Midline trachea, normal thyroid, good ROM, no lymphadenopathy Lungs; Clear to auscultation bilaterally Breast:  No dominant palpable mass, retraction, or nipple discharge Cardiovascular: Regular rate and rhythm Abdomen:  Soft, non tender, no hepatosplenomegaly Pelvic:  External genitalia is normal in appearance, no lesions.  The vagina is normal in appearance. Urethra has no lesions or masses. The cervix is bulbous.Pap with HR HPV genotyping performed.  Uterus is felt to be normal size, shape, and contour.  No adnexal masses or tenderness noted.Bladder is non tender, no masses felt. Extremities/musculoskeletal:  No swelling or varicosities noted, no clubbing or cyanosis Psych:  No mood changes, alert and cooperative,seems happy AA is 0 Fall risk is moderate    12/07/2023    2:32 PM 08/24/2022    3:38 PM 05/06/2021    1:46 PM  Depression screen PHQ 2/9  Decreased Interest 0 0 0  Down, Depressed, Hopeless 0 0 0  PHQ - 2 Score 0 0 0  Altered sleeping 0 0 0  Tired, decreased energy 0 0 1  Change in  appetite 0 0 0  Feeling bad or failure about yourself  0 0 0  Trouble concentrating 0 0 1  Moving slowly or fidgety/restless 0 0 0  Suicidal thoughts 0 0 0  PHQ-9 Score 0 0 2       12/07/2023    2:32 PM 08/24/2022    3:38 PM 05/06/2021    1:47 PM  GAD 7 : Generalized Anxiety Score  Nervous, Anxious, on Edge 0 0 1  Control/stop worrying 0 0 0  Worry too much - different things 0 0 1  Trouble relaxing 0 0 1  Restless 0 0 0  Easily annoyed or irritable 0 0 0  Afraid - awful might happen 0 0 0  Total GAD 7 Score 0 0 3      [facility] - 12/07/23 1430       Pregnancy Intention Screening   Does the patient want to become pregnant in the next year? No    Does the patient's partner want to become pregnant in the next year? No    Would the patient like to discuss contraceptive options today? No      Contraception Wrap Up   Current Method Oral Contraceptive    End Method Oral Contraceptive    Contraception Counseling Provided Yes            Examination chaperoned by Alphonso Aschoff LPN  Impression and Plan: 1. Routine general medical examination at a health care facility (Primary) Pap sent  Pap in 3 years if normal Physical in 1 year Check labs fasting, has orders  - Cytology - PAP( [place]) - CBC - Comprehensive metabolic panel with GFR - Lipid panel  2. Encounter for surveillance of contraceptive pills Happy with BCP, will refill Meds ordered this encounter  Medications   Norethindrone  Acetate-Ethinyl Estrad-FE (HAILEY  24 FE) 1-20 MG-MCG(24) tablet    Sig: Take 1 tablet by mouth daily.    Dispense:  84 tablet    Refill:  4    Supervising Provider:   Evalyn Hillier H [[id number]]     3. Screening cholesterol level - Lipid panel

## 2023-12-11 LAB — CYTOLOGY - PAP
Comment: NEGATIVE
Diagnosis: NEGATIVE
High risk HPV: NEGATIVE

## 2024-03-07 LAB — LIPID PANEL
Chol/HDL Ratio: 4.3 ratio (ref 0.0–4.4)
Cholesterol, Total: 209 mg/dL — ABNORMAL HIGH (ref 100–199)
HDL: 49 mg/dL (ref >39)
LDL Chol Calc (NIH): 123 mg/dL — ABNORMAL HIGH (ref 0–99)
Triglycerides: 210 mg/dL — ABNORMAL HIGH (ref 0–149)
VLDL Cholesterol Cal: 37 mg/dL (ref 5–40)

## 2024-03-07 LAB — COMPREHENSIVE METABOLIC PANEL WITH GFR
ALT: 9 IU/L (ref 0–32)
AST: 15 IU/L (ref 0–40)
Albumin: 4 g/dL (ref 3.9–4.9)
Alkaline Phosphatase: 82 IU/L (ref 44–121)
BUN/Creatinine Ratio: 14 (ref 9–23)
BUN: 12 mg/dL (ref 6–20)
Bilirubin Total: 0.2 mg/dL (ref 0.0–1.2)
CO2: 21 mmol/L (ref 20–29)
Calcium: 9 mg/dL (ref 8.7–10.2)
Chloride: 103 mmol/L (ref 96–106)
Creatinine, Ser: 0.86 mg/dL (ref 0.57–1.00)
Globulin, Total: 2.6 g/dL (ref 1.5–4.5)
Glucose: 89 mg/dL (ref 70–99)
Potassium: 4 mmol/L (ref 3.5–5.2)
Sodium: 138 mmol/L (ref 134–144)
Total Protein: 6.6 g/dL (ref 6.0–8.5)
eGFR: 88 mL/min/1.73 (ref >59)

## 2024-03-07 LAB — CBC
Hematocrit: 41.9 % (ref 34.0–46.6)
Hemoglobin: 13.9 g/dL (ref 11.1–15.9)
MCH: 29.8 pg (ref 26.6–33.0)
MCHC: 33.2 g/dL (ref 31.5–35.7)
MCV: 90 fL (ref 79–97)
Platelets: 323 x10E3/uL (ref 150–450)
RBC: 4.66 x10E6/uL (ref 3.77–5.28)
RDW: 12.1 % (ref 11.7–15.4)
WBC: 7.9 x10E3/uL (ref 3.4–10.8)

## 2024-03-11 ENCOUNTER — Ambulatory Visit: Payer: Self-pay | Admitting: Adult Health

## 2024-09-06 ENCOUNTER — Other Ambulatory Visit: Payer: Self-pay | Admitting: Adult Health
